# Patient Record
Sex: Male | Born: 1965 | Race: Black or African American | Hispanic: No | Marital: Married | Smoking: Never smoker
Health system: Southern US, Community
[De-identification: ages and names within clinical notes are randomized; demographics above are authoritative.]

## PROBLEM LIST (undated history)

## (undated) DIAGNOSIS — E7800 Pure hypercholesterolemia, unspecified: Secondary | ICD-10-CM

## (undated) DIAGNOSIS — L4050 Arthropathic psoriasis, unspecified: Secondary | ICD-10-CM

## (undated) HISTORY — DX: Pure hypercholesterolemia, unspecified: E78.00

## (undated) HISTORY — PX: BACK SURGERY: SHX140

## (undated) HISTORY — DX: Arthropathic psoriasis, unspecified: L40.50

---

## 2000-01-27 ENCOUNTER — Ambulatory Visit (HOSPITAL_COMMUNITY): Admission: RE | Admit: 2000-01-27 | Discharge: 2000-01-27 | Payer: Self-pay | Admitting: Urology

## 2000-01-27 ENCOUNTER — Encounter: Payer: Self-pay | Admitting: Urology

## 2007-12-24 ENCOUNTER — Encounter
Admission: RE | Admit: 2007-12-24 | Discharge: 2007-12-24 | Payer: Self-pay | Admitting: Physical Medicine and Rehabilitation

## 2008-07-14 ENCOUNTER — Emergency Department (HOSPITAL_BASED_OUTPATIENT_CLINIC_OR_DEPARTMENT_OTHER): Admission: EM | Admit: 2008-07-14 | Discharge: 2008-07-14 | Payer: Self-pay | Admitting: Emergency Medicine

## 2011-02-07 NOTE — Op Note (Signed)
[place]  Patient:    Ronald Norton, Ronald Norton                     MRN: [id number] Proc. Date: 01/27/00 Attending:  Loraine Leriche C. Vernie Ammons, M.D.                           Operative Report  PREOPERATIVE DIAGNOSIS:  Gross hematuria.  POSTOPERATIVE DIAGNOSIS:  Gross hematuria and bladder diverticulum.  PROCEDURE:  Cystoscopy and right retrograde pyelogram.  SURGEON:  Mark C. Vernie Ammons, M.D.  ANESTHESIA:  Intravenous sedation.  DRAINS:  None.  SPECIMENS:  None.  COMPLICATIONS:  None.  ESTIMATED BLOOD LOSS:  Less than 1 cc.  INDICATIONS:  The patient is a 45 year old, white male who has had an episode of gross hematuria many years ago.  He never had any further hematuria until just recently, when he had an episode of gross, painless hematuria.  This resulted in a non-contrasted CT scan, which revealed no stones.  The urothelium of the upper tract has not been evaluated, and he needs lower tract evaluation as well.  DESCRIPTION OF PROCEDURE:  After informed consent, the patient ______ , placed on the table, and administered intravenous sedation.  He was then moved into the dorsal lithotomy position.  His genitalia was thoroughly prepped and draped.  A #22 French cystoscope was introduced per the urethra.  The urethra was noted to be entirely normal down to the sphincter, which appeared intact. The prostatic urethra revealed a normal ______ , no posterior urethral valves, lesions, or other abnormalities of the prostatic urethra.  The bladder neck appeared normal.  The bladder itself had 1+ trabeculation, but no tumor, stones, or inflammatory lesions.  Just lateral and slightly superior to the left ureteral orifice seal region was a small bladder diverticulum.  This was inspected and found to be free of any tumor, stones, or inflammatory lesions as well.  The right ureteral orifice was cannulated with a #6 [place] cone-tip ureteral catheter, and a right  retrograde pyelogram was performed in the standard fashion.  This revealed a normal appearing ureter throughout its course without filling defect or other abnormality.  It drained freely.  The angle was virtually lateral though, making it very difficult to perform the study. I tried to cannulate the left orifice, but I think due to the diverticulum and the very lateral orientation of the orifice, it was difficult to gain access to the ureteral orifice.  I tried using an open-ended #6 [place] ureteral catheter and passing a guide wire, but this was unsuccessful as well.  I therefore emptied the bladder, and the patient was awakened and taken to the recovery room in stable and satisfactory condition.  He will be given 200 mg of Pyridium in the recovery room, and he will need to follow up in my office in a week for an IVP to make sure that his left system is normal, especially with the diverticulum. DD:  01/27/00 TD:  01/28/00 Job: [id number]

## 2015-12-18 ENCOUNTER — Encounter (HOSPITAL_BASED_OUTPATIENT_CLINIC_OR_DEPARTMENT_OTHER): Payer: Self-pay | Admitting: *Deleted

## 2015-12-18 ENCOUNTER — Emergency Department (HOSPITAL_BASED_OUTPATIENT_CLINIC_OR_DEPARTMENT_OTHER)
Admission: EM | Admit: 2015-12-18 | Discharge: 2015-12-18 | Disposition: A | Discharge status: Home / Self Care | Payer: BLUE CROSS/BLUE SHIELD | Attending: Emergency Medicine | Admitting: Emergency Medicine

## 2015-12-18 DIAGNOSIS — Y9389 Activity, other specified: Secondary | ICD-10-CM | POA: Diagnosis not present

## 2015-12-18 DIAGNOSIS — Y990 Civilian activity done for income or pay: Secondary | ICD-10-CM | POA: Diagnosis not present

## 2015-12-18 DIAGNOSIS — Z79899 Other long term (current) drug therapy: Secondary | ICD-10-CM | POA: Insufficient documentation

## 2015-12-18 DIAGNOSIS — W228XXA Striking against or struck by other objects, initial encounter: Secondary | ICD-10-CM | POA: Insufficient documentation

## 2015-12-18 DIAGNOSIS — S0990XA Unspecified injury of head, initial encounter: Secondary | ICD-10-CM | POA: Insufficient documentation

## 2015-12-18 DIAGNOSIS — Y9289 Other specified places as the place of occurrence of the external cause: Secondary | ICD-10-CM | POA: Insufficient documentation

## 2015-12-18 NOTE — ED Notes (Signed)
Pt verbalizes understanding of d/c instructions and denies any further needs at this time. 

## 2015-12-18 NOTE — Discharge Instructions (Signed)
Concussion, Adult  A concussion, or closed-head injury, is a brain injury caused by a direct blow to the head or by a quick and sudden movement (jolt) of the head or neck. Concussions are usually not life-threatening. Even so, the effects of a concussion can be serious. If you have had a concussion before, you are more likely to experience concussion-like symptoms after a direct blow to the head.   CAUSES  · Direct blow to the head, such as from running into another player during a soccer game, being hit in a fight, or hitting your head on a hard surface.  · A jolt of the head or neck that causes the brain to move back and forth inside the skull, such as in a car crash.  SIGNS AND SYMPTOMS  The signs of a concussion can be hard to notice. Early on, they may be missed by you, family members, and health care providers. You may look fine but act or feel differently.  Symptoms are usually temporary, but they may last for days, weeks, or even longer. Some symptoms may appear right away while others may not show up for hours or days. Every head injury is different. Symptoms include:  · Mild to moderate headaches that will not go away.  · A feeling of pressure inside your head.  · Having more trouble than usual:    Learning or remembering things you have heard.    Answering questions.    Paying attention or concentrating.    Organizing daily tasks.    Making decisions and solving problems.  · Slowness in thinking, acting or reacting, speaking, or reading.  · Getting lost or being easily confused.  · Feeling tired all the time or lacking energy (fatigued).  · Feeling drowsy.  · Sleep disturbances.    Sleeping more than usual.    Sleeping less than usual.    Trouble falling asleep.    Trouble sleeping (insomnia).  · Loss of balance or feeling lightheaded or dizzy.  · Nausea or vomiting.  · Numbness or tingling.  · Increased sensitivity to:    Sounds.    Lights.    Distractions.  · Vision problems or eyes that tire  easily.  · Diminished sense of taste or smell.  · Ringing in the ears.  · Mood changes such as feeling sad or anxious.  · Becoming easily irritated or angry for little or no reason.  · Lack of motivation.  · Seeing or hearing things other people do not see or hear (hallucinations).  DIAGNOSIS  Your health care provider can usually diagnose a concussion based on a description of your injury and symptoms. He or she will ask whether you passed out (lost consciousness) and whether you are having trouble remembering events that happened right before and during your injury.  Your evaluation might include:  · A brain scan to look for signs of injury to the brain. Even if the test shows no injury, you may still have a concussion.  · Blood tests to be sure other problems are not present.  TREATMENT  · Concussions are usually treated in an emergency department, in urgent care, or at a clinic. You may need to stay in the hospital overnight for further treatment.  · Tell your health care provider if you are taking any medicines, including prescription medicines, over-the-counter medicines, and natural remedies. Some medicines, such as blood thinners (anticoagulants) and aspirin, may increase the chance of complications. Also tell your health care   provider whether you have had alcohol or are taking illegal drugs. This information may affect treatment.  · Your health care provider will send you home with important instructions to follow.  · How fast you will recover from a concussion depends on many factors. These factors include how severe your concussion is, what part of your brain was injured, your age, and how healthy you were before the concussion.  · Most people with mild injuries recover fully. Recovery can take time. In general, recovery is slower in older persons. Also, persons who have had a concussion in the past or have other medical problems may find that it takes longer to recover from their current injury.  HOME  CARE INSTRUCTIONS  General Instructions  · Carefully follow the directions your health care provider gave you.  · Only take over-the-counter or prescription medicines for pain, discomfort, or fever as directed by your health care provider.  · Take only those medicines that your health care provider has approved.  · Do not drink alcohol until your health care provider says you are well enough to do so. Alcohol and certain other drugs may slow your recovery and can put you at risk of further injury.  · If it is harder than usual to remember things, write them down.  · If you are easily distracted, try to do one thing at a time. For example, do not try to watch TV while fixing dinner.  · Talk with family members or close friends when making important decisions.  · Keep all follow-up appointments. Repeated evaluation of your symptoms is recommended for your recovery.  · Watch your symptoms and tell others to do the same. Complications sometimes occur after a concussion. Older adults with a brain injury may have a higher risk of serious complications, such as a blood clot on the brain.  · Tell your teachers, school nurse, school counselor, coach, athletic trainer, or work manager about your injury, symptoms, and restrictions. Tell them about what you can or cannot do. They should watch for:    Increased problems with attention or concentration.    Increased difficulty remembering or learning new information.    Increased time needed to complete tasks or assignments.    Increased irritability or decreased ability to cope with stress.    Increased symptoms.  · Rest. Rest helps the brain to heal. Make sure you:    Get plenty of sleep at night. Avoid staying up late at night.    Keep the same bedtime hours on weekends and weekdays.    Rest during the day. Take daytime naps or rest breaks when you feel tired.  · Limit activities that require a lot of thought or concentration. These include:    Doing homework or job-related  work.    Watching TV.    Working on the computer.  · Avoid any situation where there is potential for another head injury (football, hockey, soccer, basketball, martial arts, downhill snow sports and horseback riding). Your condition will get worse every time you experience a concussion. You should avoid these activities until you are evaluated by the appropriate follow-up health care providers.  Returning To Your Regular Activities  You will need to return to your normal activities slowly, not all at once. You must give your body and brain enough time for recovery.  · Do not return to sports or other athletic activities until your health care provider tells you it is safe to do so.  · Ask   your health care provider when you can drive, ride a bicycle, or operate heavy machinery. Your ability to react may be slower after a brain injury. Never do these activities if you are dizzy.  · Ask your health care provider about when you can return to work or school.  Preventing Another Concussion  It is very important to avoid another brain injury, especially before you have recovered. In rare cases, another injury can lead to permanent brain damage, brain swelling, or death. The risk of this is greatest during the first 7-10 days after a head injury. Avoid injuries by:  · Wearing a seat belt when riding in a car.  · Drinking alcohol only in moderation.  · Wearing a helmet when biking, skiing, skateboarding, skating, or doing similar activities.  · Avoiding activities that could lead to a second concussion, such as contact or recreational sports, until your health care provider says it is okay.  · Taking safety measures in your home.    Remove clutter and tripping hazards from floors and stairways.    Use grab bars in bathrooms and handrails by stairs.    Place non-slip mats on floors and in bathtubs.    Improve lighting in dim areas.  SEEK MEDICAL CARE IF:  · You have increased problems paying attention or  concentrating.  · You have increased difficulty remembering or learning new information.  · You need more time to complete tasks or assignments than before.  · You have increased irritability or decreased ability to cope with stress.  · You have more symptoms than before.  Seek medical care if you have any of the following symptoms for more than 2 weeks after your injury:  · Lasting (chronic) headaches.  · Dizziness or balance problems.  · Nausea.  · Vision problems.  · Increased sensitivity to noise or light.  · Depression or mood swings.  · Anxiety or irritability.  · Memory problems.  · Difficulty concentrating or paying attention.  · Sleep problems.  · Feeling tired all the time.  SEEK IMMEDIATE MEDICAL CARE IF:  · You have severe or worsening headaches. These may be a sign of a blood clot in the brain.  · You have weakness (even if only in one hand, leg, or part of the face).  · You have numbness.  · You have decreased coordination.  · You vomit repeatedly.  · You have increased sleepiness.  · One pupil is larger than the other.  · You have convulsions.  · You have slurred speech.  · You have increased confusion. This may be a sign of a blood clot in the brain.  · You have increased restlessness, agitation, or irritability.  · You are unable to recognize people or places.  · You have neck pain.  · It is difficult to wake you up.  · You have unusual behavior changes.  · You lose consciousness.  MAKE SURE YOU:  · Understand these instructions.  · Will watch your condition.  · Will get help right away if you are not doing well or get worse.     This information is not intended to replace advice given to you by your health care provider. Make sure you discuss any questions you have with your health care provider.     Document Released: 11/29/2003 Document Revised: 09/29/2014 Document Reviewed: 03/31/2013  Elsevier Interactive Patient Education ©2016 [employer]

## 2015-12-18 NOTE — ED Notes (Signed)
Provider at the bedside, pt c/o head pain and mild nausea after hitting his head yesterday.  No LOC, no dizziness, no weakness or confusion.  Pt is alert and oriented.

## 2015-12-18 NOTE — ED Provider Notes (Signed)
CSN: [id number]     Arrival date & time 12/18/15  1723 History   First MD Initiated Contact with Patient 12/18/15 1927     Chief Complaint  Patient presents with  . Head Injury   HPI   50 year old male presents today with head injury. 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 He denies any loss of consciousness reports pain to the top of the head, no bleeding, bruising, abnormalities noted. Patient denies a neck pain, stiffness, dizziness or vomiting at that time. Patient reports today he had some light sensitivity, some nausea no vomiting. Patient denies any neurological deficits, not taking any blood thinners or anticoagulants. No other complaints noted.  Notes that several months ago patient had hit his head before, complete resolution of symptoms thereafter.    History reviewed. No pertinent past medical history. Past Surgical History  Procedure Laterality Date  . Back surgery      lumbar disk and fusion   Family History  Problem Relation Age of Onset  . Hypertension Father    Social History  Substance Use Topics  . Smoking status: Never Smoker   . Smokeless tobacco: None  . Alcohol Use: No    Review of Systems  All other systems reviewed and are negative.   Allergies  Review of patient's allergies indicates no known allergies.  Home Medications   Prior to Admission medications   Medication Sig Start Date End Date Taking? Authorizing Provider  b complex vitamins tablet Take 1 tablet by mouth daily.    Historical Provider, MD  ibuprofen (ADVIL,MOTRIN) 800 MG tablet Take 800 mg by mouth every 8 (eight) hours as needed.    Historical Provider, MD  sildenafil (REVATIO) 20 MG tablet Take 20 mg by mouth daily.    Historical Provider, MD   BP 118/81 mmHg  Pulse 72  Temp(Src) 98.4 F (36.9 C) (Oral)  Resp 18  Ht 5\' 9"  (1.753 m)  Wt 84.369 kg  BMI 27.45 kg/m2  SpO2 96%    Physical Exam  Constitutional: He is oriented to person, place, and time. He appears well-developed and well-nourished.  HENT:  Head: Normocephalic and atraumatic.  Minor tenderness to palpation of the top of the head  Eyes: Conjunctivae are normal. Pupils are equal, round, and reactive to light. Right eye exhibits no discharge. Left eye exhibits no discharge. No scleral icterus.  Neck: Normal range of motion. No JVD present. No tracheal deviation present.  Pulmonary/Chest: Effort normal. No stridor.  Musculoskeletal:  No neck pain or stiffness, nontender to palpation of the cervical or thoracic spine  Neurological: He is alert and oriented to person, place, and time. He has normal strength. No cranial nerve deficit or sensory deficit. Coordination and gait normal. GCS eye subscore is 4. GCS verbal subscore is 5. GCS motor subscore is 6.  Reflex Scores:      Patellar reflexes are 2+ on the right side and 2+ on the left side. Psychiatric: He has a normal mood and affect. His behavior is normal. Judgment and thought content normal.  Nursing note and vitals reviewed.   ED Course  Procedures (including critical care time) Labs Review Labs Reviewed - No data to display  Imaging Review No results found. I have personally reviewed and evaluated these images and lab results as part of my medical decision-making.   EKG Interpretation None  MDM   Final diagnoses:  Head injury, initial encounter    Labs:   Imaging:  Consults:  Therapeutics:  Discharge Meds:   Assessment/Plan:50 year old male presents today with head injury. He has no significant signs or symptoms of significant head trauma on exam or history. Patient does not meet CT criteria based on the [place] head CT criteria. Patient appears to be in no distress, watching the past came on the TV and avoiding my conversations. Patient has no neurological deficits, no other injuries noted. Patient discharged home with  symptomatic care instructions, and strict return precautions, and primary care follow-up if symptoms persist. Both patient and his wife verbalized understanding and agreement today's plan had no further questions or concerns at time of discharge        Eyvonne Mechanic, PA-C 12/18/15 2028  Pricilla Loveless, MD 12/20/15 [phone]

## 2015-12-18 NOTE — ED Notes (Signed)
He hit the top of his head yesterday. Sharp pain in his head since. Nausea today. Light sensitive.

## 2016-03-31 DIAGNOSIS — Z23 Encounter for immunization: Secondary | ICD-10-CM | POA: Diagnosis not present

## 2016-03-31 DIAGNOSIS — Z0000 Encounter for general adult medical examination without abnormal findings: Secondary | ICD-10-CM | POA: Diagnosis not present

## 2016-03-31 DIAGNOSIS — E782 Mixed hyperlipidemia: Secondary | ICD-10-CM | POA: Diagnosis not present

## 2016-05-15 DIAGNOSIS — G4700 Insomnia, unspecified: Secondary | ICD-10-CM | POA: Diagnosis not present

## 2016-06-11 DIAGNOSIS — M79671 Pain in right foot: Secondary | ICD-10-CM | POA: Diagnosis not present

## 2016-06-27 DIAGNOSIS — M5416 Radiculopathy, lumbar region: Secondary | ICD-10-CM | POA: Diagnosis not present

## 2016-07-01 ENCOUNTER — Other Ambulatory Visit: Payer: Self-pay | Admitting: Family Medicine

## 2016-07-01 DIAGNOSIS — M5416 Radiculopathy, lumbar region: Secondary | ICD-10-CM

## 2016-07-03 DIAGNOSIS — M25511 Pain in right shoulder: Secondary | ICD-10-CM | POA: Diagnosis not present

## 2016-07-07 DIAGNOSIS — M25511 Pain in right shoulder: Secondary | ICD-10-CM | POA: Diagnosis not present

## 2016-07-14 ENCOUNTER — Ambulatory Visit
Admission: RE | Admit: 2016-07-14 | Discharge: 2016-07-14 | Disposition: A | Discharge status: Home / Self Care | Payer: BLUE CROSS/BLUE SHIELD | Source: Ambulatory Visit | Attending: Family Medicine | Admitting: Family Medicine

## 2016-07-14 DIAGNOSIS — M48061 Spinal stenosis, lumbar region without neurogenic claudication: Secondary | ICD-10-CM | POA: Diagnosis not present

## 2016-07-14 DIAGNOSIS — M5416 Radiculopathy, lumbar region: Secondary | ICD-10-CM

## 2016-07-16 DIAGNOSIS — Z6826 Body mass index (BMI) 26.0-26.9, adult: Secondary | ICD-10-CM | POA: Diagnosis not present

## 2016-07-16 DIAGNOSIS — M5136 Other intervertebral disc degeneration, lumbar region: Secondary | ICD-10-CM | POA: Diagnosis not present

## 2016-07-16 DIAGNOSIS — M25511 Pain in right shoulder: Secondary | ICD-10-CM | POA: Diagnosis not present

## 2016-07-21 DIAGNOSIS — M25511 Pain in right shoulder: Secondary | ICD-10-CM | POA: Diagnosis not present

## 2016-08-07 DIAGNOSIS — G8918 Other acute postprocedural pain: Secondary | ICD-10-CM | POA: Diagnosis not present

## 2016-08-07 DIAGNOSIS — M5136 Other intervertebral disc degeneration, lumbar region: Secondary | ICD-10-CM | POA: Diagnosis not present

## 2016-08-07 DIAGNOSIS — Z6827 Body mass index (BMI) 27.0-27.9, adult: Secondary | ICD-10-CM | POA: Diagnosis not present

## 2016-08-07 DIAGNOSIS — M24111 Other articular cartilage disorders, right shoulder: Secondary | ICD-10-CM | POA: Diagnosis not present

## 2016-08-07 DIAGNOSIS — R030 Elevated blood-pressure reading, without diagnosis of hypertension: Secondary | ICD-10-CM | POA: Diagnosis not present

## 2016-08-07 DIAGNOSIS — S43004A Unspecified dislocation of right shoulder joint, initial encounter: Secondary | ICD-10-CM | POA: Diagnosis not present

## 2016-08-18 DIAGNOSIS — M24111 Other articular cartilage disorders, right shoulder: Secondary | ICD-10-CM | POA: Diagnosis not present

## 2016-08-26 DIAGNOSIS — M48062 Spinal stenosis, lumbar region with neurogenic claudication: Secondary | ICD-10-CM | POA: Diagnosis not present

## 2016-08-26 DIAGNOSIS — M5136 Other intervertebral disc degeneration, lumbar region: Secondary | ICD-10-CM | POA: Diagnosis not present

## 2016-09-03 DIAGNOSIS — R030 Elevated blood-pressure reading, without diagnosis of hypertension: Secondary | ICD-10-CM | POA: Diagnosis not present

## 2016-09-03 DIAGNOSIS — M48062 Spinal stenosis, lumbar region with neurogenic claudication: Secondary | ICD-10-CM | POA: Diagnosis not present

## 2016-09-12 DIAGNOSIS — M24111 Other articular cartilage disorders, right shoulder: Secondary | ICD-10-CM | POA: Diagnosis not present

## 2016-12-23 DIAGNOSIS — L218 Other seborrheic dermatitis: Secondary | ICD-10-CM | POA: Diagnosis not present

## 2017-03-12 DIAGNOSIS — Z113 Encounter for screening for infections with a predominantly sexual mode of transmission: Secondary | ICD-10-CM | POA: Diagnosis not present

## 2017-03-12 DIAGNOSIS — Z7253 High risk bisexual behavior: Secondary | ICD-10-CM | POA: Diagnosis not present

## 2017-04-17 DIAGNOSIS — Z0000 Encounter for general adult medical examination without abnormal findings: Secondary | ICD-10-CM | POA: Diagnosis not present

## 2017-04-17 DIAGNOSIS — Z13220 Encounter for screening for lipoid disorders: Secondary | ICD-10-CM | POA: Diagnosis not present

## 2017-04-23 DIAGNOSIS — M1712 Unilateral primary osteoarthritis, left knee: Secondary | ICD-10-CM | POA: Diagnosis not present

## 2017-04-23 DIAGNOSIS — M1711 Unilateral primary osteoarthritis, right knee: Secondary | ICD-10-CM | POA: Diagnosis not present

## 2018-04-13 ENCOUNTER — Emergency Department (HOSPITAL_BASED_OUTPATIENT_CLINIC_OR_DEPARTMENT_OTHER)
Admission: EM | Admit: 2018-04-13 | Discharge: 2018-04-13 | Disposition: A | Discharge status: Home / Self Care | Payer: BLUE CROSS/BLUE SHIELD | Attending: Emergency Medicine | Admitting: Emergency Medicine

## 2018-04-13 ENCOUNTER — Encounter (HOSPITAL_BASED_OUTPATIENT_CLINIC_OR_DEPARTMENT_OTHER): Payer: Self-pay

## 2018-04-13 ENCOUNTER — Other Ambulatory Visit: Payer: Self-pay

## 2018-04-13 DIAGNOSIS — Y9389 Activity, other specified: Secondary | ICD-10-CM | POA: Insufficient documentation

## 2018-04-13 DIAGNOSIS — Y929 Unspecified place or not applicable: Secondary | ICD-10-CM | POA: Insufficient documentation

## 2018-04-13 DIAGNOSIS — S61412A Laceration without foreign body of left hand, initial encounter: Secondary | ICD-10-CM | POA: Diagnosis not present

## 2018-04-13 DIAGNOSIS — W270XXA Contact with workbench tool, initial encounter: Secondary | ICD-10-CM | POA: Insufficient documentation

## 2018-04-13 DIAGNOSIS — Y990 Civilian activity done for income or pay: Secondary | ICD-10-CM | POA: Diagnosis not present

## 2018-04-13 MED ORDER — LIDOCAINE-EPINEPHRINE (PF) 2 %-1:200000 IJ SOLN
10.0000 mL | Freq: Once | INTRAMUSCULAR | Status: AC
  Administered 2018-04-13: 10 mL via INTRADERMAL
  Filled 2018-04-13 (×2): qty 10

## 2018-04-13 NOTE — Discharge Instructions (Addendum)
Keep your hand clean and dry.  Wash with soap and water.  Apply triple antibiotic ointment twice a day.  Follow-up in 7 to 10 days for suture removal.  Return if any signs of infection

## 2018-04-13 NOTE — ED Triage Notes (Signed)
Pt states he pinched hand between 2 wrenches at work approx 3pm-small lac noted-NAD-steady gait

## 2018-04-13 NOTE — ED Provider Notes (Signed)
[facility] EMERGENCY DEPARTMENT Provider Note   CSN: [id number] Arrival date & time: 04/13/18  2006     History   Chief Complaint Chief Complaint  Patient presents with  . Hand Injury    HPI Ronald CapeDano B Henslee Jr. is a 52 y.o. male.  HPI  Ronald CapeDano B Brandle Jr. is a 52 y.o. male Presents to emergency department complaining of hand laceration.  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  He sustained a laceration to the palm of the hand.  He denies any weakness or numbness to the fingers.  He states bleeding was controlled with pressure.  He states he washed his hand and applied peroxide.  He denies any other treatment.  States "insides are coming out that so I decided to come in."  Tetanus is within last 3 years.   History reviewed. No pertinent past medical history.  There are no active problems to display for this patient.   Past Surgical History:  Procedure Laterality Date  . BACK SURGERY     lumbar disk and fusion        Home Medications    Prior to Admission medications   Medication Sig Start Date End Date Taking? Authorizing Provider  b complex vitamins tablet Take 1 tablet by mouth daily.    [provider]  ibuprofen (ADVIL,MOTRIN) 800 MG tablet Take 800 mg by mouth every 8 (eight) hours as needed.    [provider]  sildenafil (REVATIO) 20 MG tablet Take 20 mg by mouth daily.    [provider]    Family History Family History  Problem Relation Age of Onset  . Hypertension Father     Social History Social History   Tobacco Use  . Smoking status: Never Smoker  . Smokeless tobacco: Never Used  Substance Use Topics  . Alcohol use: Yes    Comment: occ  . Drug use: No     Allergies   Patient has no known allergies.   Review of Systems Review of Systems  Constitutional: Negative for chills and fever.  Skin: Positive for wound.  Neurological: Negative  for weakness and numbness.  All other systems reviewed and are negative.    Physical Exam Updated Vital Signs BP (!) 132/92 (BP Location: Right Arm)   Pulse 80   Temp 98.4 F (36.9 C) (Oral)   Resp 18   Ht 5\' 9"  (1.753 m)   Wt 82.6 kg (182 lb)   SpO2 99%   BMI 26.88 kg/m    Physical Exam  Constitutional: He appears well-developed and well-nourished. No distress.  Eyes: Conjunctivae are normal.  Neck: Neck supple.  Cardiovascular: Normal rate.  Pulmonary/Chest: No respiratory distress.  Abdominal: He exhibits no distension.  Musculoskeletal:  1 cm laceration to the palm of the left hand, just proximal to the fourth and fifth MCP joints.  Appears to be superficial but gaping.  Some fatty tissue exposed.  Skin: Skin is warm and dry.  Nursing note and vitals reviewed.    ED Treatments / Results  Labs (all labs ordered are listed, but only abnormal results are displayed) Labs Reviewed - No data to display  EKG None  Radiology No results found.  Procedures .Marland Kitchen.Laceration Repair Date/Time: 04/13/2018 10:48 PM Performed by: Jaynie CrumbleKirichenko, Dacia Capers, PA-C Authorized by: Jaynie CrumbleKirichenko, Ritika Hellickson, PA-C   Consent:    Consent obtained:  Verbal   Consent given by:  Patient   Risks discussed:  Infection, pain, retained foreign body, need for additional repair and poor cosmetic result   Alternatives discussed:  No treatment Anesthesia (see MAR for exact dosages):    Anesthesia method:  Local infiltration   Local anesthetic:  Lidocaine 2% WITH epi Laceration details:    Location:  Hand   Hand location:  L palm   Length (cm):  1 Repair type:    Repair type:  Simple Pre-procedure details:    Preparation:  Patient was prepped and draped in usual sterile fashion Exploration:    Wound exploration: wound explored through full range of motion     Wound extent: no muscle damage noted, no nerve damage noted and no tendon damage noted     Contaminated: yes   Treatment:    Area cleansed  with:  Saline, Shur-Clens and Betadine   Amount of cleaning:  Extensive   Irrigation solution:  Sterile saline   Irrigation method:  Syringe and pressure wash Skin repair:    Repair method:  Sutures   Suture size:  5-0   Suture material:  Prolene   Suture technique:  Simple interrupted   Number of sutures:  3 Approximation:    Approximation:  Close Post-procedure details:    Dressing:  Antibiotic ointment   Patient tolerance of procedure:  Tolerated well, no immediate complications   (including critical care time)  Medications Ordered in ED Medications  lidocaine-EPINEPHrine (XYLOCAINE W/EPI) 2 %-1:200000 (PF) injection 10 mL (has no administration in time range)     Initial Impression / Assessment and Plan / ED Course  I have reviewed the triage vital signs and the nursing notes.  Pertinent labs & imaging results that were available during my care of the patient were reviewed by me and considered in my medical decision making (see chart for details).     Patient with a very small laceration to the left hand, however gaping with some fatty tissue exposed.  Required 3 sutures to close.  Wound was contaminated with grease oil and dirt.  I extensively irrigated the wound and washed with saline and iodine.  Repaired with Prolene.  Patient's tetanus is up-to-date.  We will have him follow-up in 7 to 10 days for suture removal.  Topical antibiotic ointment.  Return precautions discussed.  Vitals:   04/13/18 2015 04/13/18 2018 04/13/18 2212  BP:  (!) 132/92 125/85  Pulse:  80 73  Resp:  18 16  Temp:  98.4 F (36.9 C)   TempSrc:  Oral   SpO2:  99% 99%  Weight: 82.6 kg (182 lb)    Height: 5\' 9"  (1.753 m)      Final Clinical Impressions(s) / ED Diagnoses   Final diagnoses:  Laceration of left hand without foreign body, initial encounter    ED Discharge Orders    None      Jaynie Crumble, PA-C 04/13/18 2250  Maia Plan, MD 04/14/18 1114

## 2018-05-11 DIAGNOSIS — Z0000 Encounter for general adult medical examination without abnormal findings: Secondary | ICD-10-CM | POA: Diagnosis not present

## 2018-05-11 DIAGNOSIS — Z125 Encounter for screening for malignant neoplasm of prostate: Secondary | ICD-10-CM | POA: Diagnosis not present

## 2018-05-11 DIAGNOSIS — Z13220 Encounter for screening for lipoid disorders: Secondary | ICD-10-CM | POA: Diagnosis not present

## 2018-05-13 DIAGNOSIS — M549 Dorsalgia, unspecified: Secondary | ICD-10-CM | POA: Diagnosis not present

## 2018-05-13 DIAGNOSIS — L299 Pruritus, unspecified: Secondary | ICD-10-CM | POA: Diagnosis not present

## 2018-05-13 DIAGNOSIS — Z0000 Encounter for general adult medical examination without abnormal findings: Secondary | ICD-10-CM | POA: Diagnosis not present

## 2018-05-13 DIAGNOSIS — R251 Tremor, unspecified: Secondary | ICD-10-CM | POA: Diagnosis not present

## 2018-05-13 DIAGNOSIS — G4700 Insomnia, unspecified: Secondary | ICD-10-CM | POA: Diagnosis not present

## 2018-06-15 ENCOUNTER — Encounter: Payer: Self-pay | Admitting: Diagnostic Neuroimaging

## 2018-06-15 ENCOUNTER — Encounter: Payer: Self-pay | Admitting: *Deleted

## 2018-06-15 ENCOUNTER — Ambulatory Visit: Payer: BLUE CROSS/BLUE SHIELD | Admitting: Diagnostic Neuroimaging

## 2018-06-15 VITALS — BP 142/83 | HR 90 | Ht 69.0 in | Wt 182.0 lb

## 2018-06-15 DIAGNOSIS — R251 Tremor, unspecified: Secondary | ICD-10-CM | POA: Diagnosis not present

## 2018-06-15 DIAGNOSIS — G250 Essential tremor: Secondary | ICD-10-CM | POA: Diagnosis not present

## 2018-06-15 MED ORDER — PROPRANOLOL HCL 40 MG PO TABS: 40.0000 mg | ORAL_TABLET | Freq: Two times a day (BID) | ORAL | 12 refills | Status: DC

## 2018-06-15 NOTE — Patient Instructions (Signed)
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

## 2018-06-15 NOTE — Progress Notes (Signed)
[facility]  PATIENT: Ronald Norton. DOB: [birth year]  REFERRING CLINICIAN: C A Ross HISTORY FROM: patient  REASON FOR VISIT: new consult    HISTORICAL  CHIEF COMPLAINT:  Chief Complaint  Patient presents with  . New Patient (Initial Visit)    Rm 6, alone  . progressive tremor    Dr. Tenny Craw.  Has had tremors over the last several yrs.  Recently over the last month, pregressively gotten worse.  Bilateral hands,  R>L.      HISTORY OF PRESENT ILLNESS:   52 year old male here for evaluation of tremors.  Patient has had mild postural tremors since 2009.  Is gotten worse.  He has noticed particular problems in the last 6 to 12 months.  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  Symptoms fluctuate on a day-to-day basis.  No specific triggering factors.  No change with alcohol.  No family history of tremor.  He has noticed some mild head tremor in the last few months.   REVIEW OF SYSTEMS: Full 14 system review of systems performed and negative with exception of: Cough chest pain.  ALLERGIES: No Known Allergies  HOME MEDICATIONS: Outpatient Medications Prior to Visit  Medication Sig Dispense Refill  . ibuprofen (ADVIL,MOTRIN) 800 MG tablet Take 800 mg by mouth every 8 (eight) hours as needed.    . Melatonin 5 MG CAPS Take 1 capsule by mouth at bedtime as needed.    . sildenafil (REVATIO) 20 MG tablet Take 20 mg by mouth daily.    . traZODone (DESYREL) 100 MG tablet Take 100-200 mg by mouth at bedtime.      No facility-administered medications prior to visit.     PAST MEDICAL HISTORY: Past Medical History:  Diagnosis Date  . High cholesterol     PAST SURGICAL HISTORY: Past Surgical History:  Procedure Laterality Date  . BACK SURGERY     lumbar disk and fusion    FAMILY HISTORY: Family History  Problem Relation Age of Onset  . Arthritis Mother        degenerative disc disease  .  Hypertension Father   . Arthritis/Rheumatoid Father   . Diabetes Maternal Uncle   . Diabetes Paternal Uncle     SOCIAL HISTORY: Social History   Socioeconomic History  . Marital status: Married    Spouse name: Not on file  . Number of children: Not on file  . Years of education: Not on file  . Highest education level: Not on file  Occupational History  . Not on file  Social Needs  . Financial resource strain: Not on file  . Food insecurity:    Worry: Not on file    Inability: Not on file  . Transportation needs:    Medical: Not on file    Non-medical: Not on file  Tobacco Use  . Smoking status: Never Smoker  . Smokeless tobacco: Never Used  Substance and Sexual Activity  . Alcohol use: Yes    Comment: occ  . Drug use: No  . Sexual activity: Not on file  Lifestyle  . Physical activity:    Days per week: Not on file    Minutes per session: Not on file  . Stress: Not on file  Relationships  . Social connections:    Talks on phone: Not on file    Gets together: Not on file    Attends religious service: Not on file  Active member of club or organization: Not on file    Attends meetings of clubs or organizations: Not on file    Relationship status: Not on file  . Intimate partner violence:    Fear of current or ex partner: Not on file    Emotionally abused: Not on file    Physically abused: Not on file    Forced sexual activity: Not on file  Other Topics Concern  . Not on file  Social History Narrative   Lives home with wife, cheryl.  Is self employed.  Education AD.  Children one. Caffeine oz at lunch.       PHYSICAL EXAM  GENERAL EXAM/CONSTITUTIONAL: Vitals:  Vitals:   06/15/18 1540  BP: (!) 142/83  Pulse: 90  Weight: 182 lb (82.6 kg)  Height: 5\' 9"  (1.753 m)     Body mass index is 26.88 kg/m. Wt Readings from Last 3 Encounters:  06/15/18 182 lb (82.6 kg)  04/13/18 182 lb (82.6 kg)  12/18/15 186 lb (84.4 kg)     Patient is in no distress;  well developed, nourished and groomed; neck is supple  CARDIOVASCULAR:  Examination of carotid arteries is normal; no carotid bruits  Regular rate and rhythm, no murmurs  Examination of peripheral vascular system by observation and palpation is normal  EYES:  Ophthalmoscopic exam of optic discs and posterior segments is normal; no papilledema or hemorrhages  Visual Acuity Screening   Right eye Left eye Both eyes  Without correction:     With correction: 20/20 20/20      MUSCULOSKELETAL:  Gait, strength, tone, movements noted in Neurologic exam below  NEUROLOGIC: MENTAL STATUS:  No flowsheet data found.  awake, alert, oriented to person, place and time  recent and remote memory intact  normal attention and concentration  language fluent, comprehension intact, naming intact  fund of knowledge appropriate  CRANIAL NERVE:   2nd - no papilledema on fundoscopic exam  2nd, 3rd, 4th, 6th - pupils equal and reactive to light, visual fields full to confrontation, extraocular muscles intact, no nystagmus  5th - facial sensation symmetric  7th - facial strength symmetric  8th - hearing intact  9th - palate elevates symmetrically, uvula midline  11th - shoulder shrug symmetric  12th - tongue protrusion midline  MOTOR:   MINIMAL POSTURAL AND ACTION TREMOR (LUE > RUE)  NO BRADYKINESIA OR RIGIDITY  normal bulk and tone, full strength in the BUE, BLE  SENSORY:   normal and symmetric to light touch, temperature, vibration  COORDINATION:   finger-nose-finger, fine finger movements normal  REFLEXES:   deep tendon reflexes TRACE and symmetric  GAIT/STATION:   narrow based gait    DIAGNOSTIC DATA (LABS, IMAGING, TESTING) - I reviewed patient records, labs, notes, testing and imaging myself where available.  No results found for: WBC, HGB, HCT, MCV, PLT No results found for: NA, K, CL, CO2, GLUCOSE, BUN, CREATININE, CALCIUM, PROT, ALBUMIN, AST, ALT,  ALKPHOS, BILITOT, GFRNONAA, GFRAA No results found for: CHOL, HDL, LDLCALC, LDLDIRECT, TRIG, CHOLHDL No results found for: WGNF6OHGBA1C No results found for: VITAMINB12 No results found for: TSH  2017 B12 383  2017 TSH 1.12    ASSESSMENT AND PLAN  52 y.o. year old male here with gradual onset progressive postural tremor since 2009, most likely represents essential tremor.  Will check MRI of the brain to rule out other secondary causes.  We will treat with propranolol for symptom control.  Dx:  1. Tremor   2.  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 MR BRAIN W WO CONTRAST   Meds ordered this encounter  Medications  . propranolol (INDERAL) 40 MG tablet    Sig: Take 1 tablet (40 mg total) by mouth 2 (two) times daily.    Dispense:  60 tablet    Refill:  12   Return in about 6 months (around 12/14/2018).    Suanne Marker, MD 06/15/2018, 4:01 PM Certified in Neurology, Neurophysiology and Neuroimaging  [facility] [address], [address] [place], [place] [address] [phone]

## 2018-06-16 ENCOUNTER — Telehealth: Payer: Self-pay | Admitting: Diagnostic Neuroimaging

## 2018-06-16 NOTE — Telephone Encounter (Signed)
[employer] pending faxed clinical notes  °

## 2018-06-16 NOTE — Telephone Encounter (Signed)
I called [employer] they informed me it is needing a peer to peer. The phone number for the peer to peer is [phone]. The member ID is [id number] & DOB is 30-Nov-1965. The case does close on 06/18/18.

## 2018-06-17 NOTE — Telephone Encounter (Signed)
Scan approved.  auth approval / qual review numb: [id number]  -VRP

## 2018-06-21 NOTE — Telephone Encounter (Signed)
Noted, thank you

## 2018-06-21 NOTE — Telephone Encounter (Signed)
LVM for pt to call back about scheduling mri  [employer] auth: [id number] (exp. 06/16/18 to 07/15/18)

## 2018-09-17 DIAGNOSIS — M48062 Spinal stenosis, lumbar region with neurogenic claudication: Secondary | ICD-10-CM | POA: Diagnosis not present

## 2018-12-16 ENCOUNTER — Telehealth: Payer: Self-pay | Admitting: *Deleted

## 2018-12-16 NOTE — Telephone Encounter (Signed)
Attempted to reach patient re: need to convert his FU to video visit. His voice MB was full. Will try later.

## 2018-12-20 ENCOUNTER — Ambulatory Visit: Payer: BLUE CROSS/BLUE SHIELD | Admitting: Diagnostic Neuroimaging

## 2018-12-20 ENCOUNTER — Encounter: Payer: Self-pay | Admitting: *Deleted

## 2018-12-20 NOTE — Telephone Encounter (Addendum)
Attempted to reach patient again to convert FU to video. No answer; voice MB full.  Reached patient on work # and advised him that due to COVID 19 we are recommending converting his appointments to a video appt.  He stated he wasn't sure if he had capability, and he was doing fine. He stated his symptoms are not worse, and he has refills of propranolol. I advised we can reschedule for 6-8 weeks out with Dr Marjory Lies or his NP.  He stated he wanted to think about it and would call back. He verbalized understanding, appreciation.

## 2019-06-30 DIAGNOSIS — Z0000 Encounter for general adult medical examination without abnormal findings: Secondary | ICD-10-CM | POA: Diagnosis not present

## 2019-06-30 DIAGNOSIS — G4700 Insomnia, unspecified: Secondary | ICD-10-CM | POA: Diagnosis not present

## 2019-06-30 DIAGNOSIS — B356 Tinea cruris: Secondary | ICD-10-CM | POA: Diagnosis not present

## 2019-06-30 DIAGNOSIS — Z13220 Encounter for screening for lipoid disorders: Secondary | ICD-10-CM | POA: Diagnosis not present

## 2019-06-30 DIAGNOSIS — M791 Myalgia, unspecified site: Secondary | ICD-10-CM | POA: Diagnosis not present

## 2019-06-30 DIAGNOSIS — E7889 Other lipoprotein metabolism disorders: Secondary | ICD-10-CM | POA: Diagnosis not present

## 2019-06-30 DIAGNOSIS — R634 Abnormal weight loss: Secondary | ICD-10-CM | POA: Diagnosis not present

## 2019-06-30 DIAGNOSIS — Z125 Encounter for screening for malignant neoplasm of prostate: Secondary | ICD-10-CM | POA: Diagnosis not present

## 2019-08-10 DIAGNOSIS — Z1211 Encounter for screening for malignant neoplasm of colon: Secondary | ICD-10-CM | POA: Diagnosis not present

## 2019-08-10 DIAGNOSIS — R634 Abnormal weight loss: Secondary | ICD-10-CM | POA: Diagnosis not present

## 2019-08-10 DIAGNOSIS — R110 Nausea: Secondary | ICD-10-CM | POA: Diagnosis not present

## 2019-08-15 ENCOUNTER — Other Ambulatory Visit: Payer: Self-pay | Admitting: Gastroenterology

## 2019-08-15 DIAGNOSIS — R634 Abnormal weight loss: Secondary | ICD-10-CM

## 2019-08-22 ENCOUNTER — Other Ambulatory Visit: Payer: Self-pay | Admitting: Gastroenterology

## 2019-08-22 DIAGNOSIS — R110 Nausea: Secondary | ICD-10-CM

## 2019-08-25 ENCOUNTER — Other Ambulatory Visit: Payer: BLUE CROSS/BLUE SHIELD

## 2019-09-08 ENCOUNTER — Ambulatory Visit
Admission: RE | Admit: 2019-09-08 | Discharge: 2019-09-08 | Disposition: A | Discharge status: Home / Self Care | Payer: BLUE CROSS/BLUE SHIELD | Source: Ambulatory Visit | Attending: Gastroenterology | Admitting: Gastroenterology

## 2019-09-08 DIAGNOSIS — R110 Nausea: Secondary | ICD-10-CM

## 2019-09-08 DIAGNOSIS — N281 Cyst of kidney, acquired: Secondary | ICD-10-CM | POA: Diagnosis not present

## 2019-09-12 DIAGNOSIS — D485 Neoplasm of uncertain behavior of skin: Secondary | ICD-10-CM | POA: Diagnosis not present

## 2019-09-12 DIAGNOSIS — L308 Other specified dermatitis: Secondary | ICD-10-CM | POA: Diagnosis not present

## 2019-09-13 DIAGNOSIS — Z20828 Contact with and (suspected) exposure to other viral communicable diseases: Secondary | ICD-10-CM | POA: Diagnosis not present

## 2019-09-14 ENCOUNTER — Other Ambulatory Visit: Payer: Self-pay | Admitting: Gastroenterology

## 2019-09-14 DIAGNOSIS — R935 Abnormal findings on diagnostic imaging of other abdominal regions, including retroperitoneum: Secondary | ICD-10-CM

## 2019-10-26 DIAGNOSIS — M7542 Impingement syndrome of left shoulder: Secondary | ICD-10-CM | POA: Diagnosis not present

## 2019-12-01 ENCOUNTER — Ambulatory Visit: Payer: BC Managed Care – PPO | Attending: Internal Medicine

## 2019-12-01 DIAGNOSIS — Z23 Encounter for immunization: Secondary | ICD-10-CM

## 2019-12-01 NOTE — Progress Notes (Signed)
   [id number]  Name:  Ronald Norton.    MRN: [id number] DOB: October 21, 1965  12/01/2019  Ronald Norton was observed post Covid-19 immunization for 15 minutes without incident. He was provided with Vaccine Information Sheet and instruction to access the V-Safe system.   Ronald Norton was instructed to call 911 with any severe reactions post vaccine: Marland Kitchen Difficulty breathing  . Swelling of face and throat  . A fast heartbeat  . A bad rash all over body  . Dizziness and weakness   Immunizations Administered    Name Date Dose VIS Date Route   Pfizer COVID-19 Vaccine 12/01/2019  2:47 PM 0.3 mL 09/02/2019 Intramuscular   Manufacturer: [employer], [employer]   Lot: [id number]   NDC: [address]-1

## 2019-12-07 DIAGNOSIS — M7542 Impingement syndrome of left shoulder: Secondary | ICD-10-CM | POA: Diagnosis not present

## 2019-12-26 ENCOUNTER — Ambulatory Visit: Payer: BC Managed Care – PPO | Attending: Internal Medicine

## 2019-12-26 DIAGNOSIS — Z23 Encounter for immunization: Secondary | ICD-10-CM

## 2019-12-26 NOTE — Progress Notes (Signed)
   [id number]  Name:  Ronald Norton.    MRN: [id number] DOB: 1965-10-14  12/26/2019  Mr. Ronald Norton was observed post Covid-19 immunization for 15 minutes without incident. He was provided with Vaccine Information Sheet and instruction to access the V-Safe system.   Mr. Ronald Norton was instructed to call 911 with any severe reactions post vaccine: Marland Kitchen Difficulty breathing  . Swelling of face and throat  . A fast heartbeat  . A bad rash all over body  . Dizziness and weakness   Immunizations Administered    Name Date Dose VIS Date Route   Pfizer COVID-19 Vaccine 12/26/2019 10:45 AM 0.3 mL 09/02/2019 Intramuscular   Manufacturer: [employer], [employer]   Lot: [id number]   NDC: [address]-9

## 2020-01-31 ENCOUNTER — Other Ambulatory Visit: Payer: Self-pay | Admitting: Gastroenterology

## 2020-01-31 DIAGNOSIS — R935 Abnormal findings on diagnostic imaging of other abdominal regions, including retroperitoneum: Secondary | ICD-10-CM

## 2020-02-02 DIAGNOSIS — M2550 Pain in unspecified joint: Secondary | ICD-10-CM | POA: Diagnosis not present

## 2020-02-16 DIAGNOSIS — Z6825 Body mass index (BMI) 25.0-25.9, adult: Secondary | ICD-10-CM | POA: Diagnosis not present

## 2020-02-16 DIAGNOSIS — R5382 Chronic fatigue, unspecified: Secondary | ICD-10-CM | POA: Diagnosis not present

## 2020-02-16 DIAGNOSIS — M2550 Pain in unspecified joint: Secondary | ICD-10-CM | POA: Diagnosis not present

## 2020-02-16 DIAGNOSIS — E663 Overweight: Secondary | ICD-10-CM | POA: Diagnosis not present

## 2020-02-27 ENCOUNTER — Other Ambulatory Visit: Payer: Self-pay | Admitting: Gastroenterology

## 2020-02-27 DIAGNOSIS — L4059 Other psoriatic arthropathy: Secondary | ICD-10-CM | POA: Diagnosis not present

## 2020-02-27 DIAGNOSIS — M2550 Pain in unspecified joint: Secondary | ICD-10-CM | POA: Diagnosis not present

## 2020-02-27 DIAGNOSIS — M0579 Rheumatoid arthritis with rheumatoid factor of multiple sites without organ or systems involvement: Secondary | ICD-10-CM | POA: Diagnosis not present

## 2020-02-27 DIAGNOSIS — R5382 Chronic fatigue, unspecified: Secondary | ICD-10-CM | POA: Diagnosis not present

## 2020-02-29 ENCOUNTER — Other Ambulatory Visit: Payer: Self-pay

## 2020-02-29 ENCOUNTER — Ambulatory Visit
Admission: RE | Admit: 2020-02-29 | Discharge: 2020-02-29 | Disposition: A | Discharge status: Home / Self Care | Payer: BC Managed Care – PPO | Source: Ambulatory Visit | Attending: Gastroenterology | Admitting: Gastroenterology

## 2020-02-29 DIAGNOSIS — R935 Abnormal findings on diagnostic imaging of other abdominal regions, including retroperitoneum: Secondary | ICD-10-CM

## 2020-02-29 DIAGNOSIS — N281 Cyst of kidney, acquired: Secondary | ICD-10-CM | POA: Diagnosis not present

## 2020-02-29 MED ORDER — GADOBENATE DIMEGLUMINE 529 MG/ML IV SOLN
16.0000 mL | Freq: Once | INTRAVENOUS | Status: AC | PRN
  Administered 2020-02-29: 16 mL via INTRAVENOUS

## 2020-04-17 DIAGNOSIS — R634 Abnormal weight loss: Secondary | ICD-10-CM | POA: Diagnosis not present

## 2020-04-17 DIAGNOSIS — K228 Other specified diseases of esophagus: Secondary | ICD-10-CM | POA: Diagnosis not present

## 2020-04-17 DIAGNOSIS — K449 Diaphragmatic hernia without obstruction or gangrene: Secondary | ICD-10-CM | POA: Diagnosis not present

## 2020-04-17 DIAGNOSIS — K2930 Chronic superficial gastritis without bleeding: Secondary | ICD-10-CM | POA: Diagnosis not present

## 2020-04-17 DIAGNOSIS — Z1211 Encounter for screening for malignant neoplasm of colon: Secondary | ICD-10-CM | POA: Diagnosis not present

## 2020-04-17 DIAGNOSIS — K222 Esophageal obstruction: Secondary | ICD-10-CM | POA: Diagnosis not present

## 2020-04-17 DIAGNOSIS — K621 Rectal polyp: Secondary | ICD-10-CM | POA: Diagnosis not present

## 2020-04-17 DIAGNOSIS — K5730 Diverticulosis of large intestine without perforation or abscess without bleeding: Secondary | ICD-10-CM | POA: Diagnosis not present

## 2020-04-17 DIAGNOSIS — K648 Other hemorrhoids: Secondary | ICD-10-CM | POA: Diagnosis not present

## 2020-04-17 DIAGNOSIS — K2970 Gastritis, unspecified, without bleeding: Secondary | ICD-10-CM | POA: Diagnosis not present

## 2020-04-17 DIAGNOSIS — R110 Nausea: Secondary | ICD-10-CM | POA: Diagnosis not present

## 2020-04-30 DIAGNOSIS — R5382 Chronic fatigue, unspecified: Secondary | ICD-10-CM | POA: Diagnosis not present

## 2020-04-30 DIAGNOSIS — M0579 Rheumatoid arthritis with rheumatoid factor of multiple sites without organ or systems involvement: Secondary | ICD-10-CM | POA: Diagnosis not present

## 2020-04-30 DIAGNOSIS — L4059 Other psoriatic arthropathy: Secondary | ICD-10-CM | POA: Diagnosis not present

## 2020-04-30 DIAGNOSIS — M2550 Pain in unspecified joint: Secondary | ICD-10-CM | POA: Diagnosis not present

## 2020-06-07 DIAGNOSIS — L408 Other psoriasis: Secondary | ICD-10-CM | POA: Diagnosis not present

## 2020-07-03 DIAGNOSIS — Z0000 Encounter for general adult medical examination without abnormal findings: Secondary | ICD-10-CM | POA: Diagnosis not present

## 2020-07-03 DIAGNOSIS — G4700 Insomnia, unspecified: Secondary | ICD-10-CM | POA: Diagnosis not present

## 2020-07-03 DIAGNOSIS — K219 Gastro-esophageal reflux disease without esophagitis: Secondary | ICD-10-CM | POA: Diagnosis not present

## 2020-07-03 DIAGNOSIS — Z125 Encounter for screening for malignant neoplasm of prostate: Secondary | ICD-10-CM | POA: Diagnosis not present

## 2020-07-03 DIAGNOSIS — E782 Mixed hyperlipidemia: Secondary | ICD-10-CM | POA: Diagnosis not present

## 2020-07-03 DIAGNOSIS — N529 Male erectile dysfunction, unspecified: Secondary | ICD-10-CM | POA: Diagnosis not present

## 2020-08-15 DIAGNOSIS — R040 Epistaxis: Secondary | ICD-10-CM | POA: Diagnosis not present

## 2020-08-15 DIAGNOSIS — S022XXA Fracture of nasal bones, initial encounter for closed fracture: Secondary | ICD-10-CM | POA: Diagnosis not present

## 2020-08-15 DIAGNOSIS — W01198A Fall on same level from slipping, tripping and stumbling with subsequent striking against other object, initial encounter: Secondary | ICD-10-CM | POA: Diagnosis not present

## 2020-08-15 DIAGNOSIS — S0121XA Laceration without foreign body of nose, initial encounter: Secondary | ICD-10-CM | POA: Diagnosis not present

## 2020-08-25 ENCOUNTER — Ambulatory Visit: Payer: BC Managed Care – PPO | Attending: Internal Medicine

## 2020-08-25 DIAGNOSIS — Z23 Encounter for immunization: Secondary | ICD-10-CM

## 2020-08-25 NOTE — Progress Notes (Signed)
   [id number]  Name:  Jackson Coffield.    MRN: [id number] DOB: [birth year]  08/25/2020  Mr. Molina was observed post Covid-19 immunization for 15 minutes without incident. He was provided with Vaccine Information Sheet and instruction to access the V-Safe system.   Mr. Krolak was instructed to call 911 with any severe reactions post vaccine: Marland Kitchen Difficulty breathing  . Swelling of face and throat  . A fast heartbeat  . A bad rash all over body  . Dizziness and weakness   Immunizations Administered    Name Date Dose VIS Date Route   Pfizer COVID-19 Vaccine 08/25/2020  1:02 PM 0.3 mL 07/11/2020 Intramuscular   Manufacturer: [employer], [employer]   Lot: [id number]   NDC: [address]-1

## 2020-08-29 DIAGNOSIS — S022XXS Fracture of nasal bones, sequela: Secondary | ICD-10-CM | POA: Diagnosis not present

## 2020-08-29 DIAGNOSIS — Z09 Encounter for follow-up examination after completed treatment for conditions other than malignant neoplasm: Secondary | ICD-10-CM | POA: Diagnosis not present

## 2020-10-02 ENCOUNTER — Other Ambulatory Visit: Payer: Self-pay

## 2020-10-02 ENCOUNTER — Encounter: Payer: Self-pay | Admitting: Internal Medicine

## 2020-10-02 ENCOUNTER — Ambulatory Visit: Payer: BC Managed Care – PPO | Admitting: Internal Medicine

## 2020-10-02 VITALS — BP 120/78 | HR 71 | Ht 68.75 in | Wt 178.6 lb

## 2020-10-02 DIAGNOSIS — L4050 Arthropathic psoriasis, unspecified: Secondary | ICD-10-CM | POA: Diagnosis not present

## 2020-10-02 DIAGNOSIS — M1712 Unilateral primary osteoarthritis, left knee: Secondary | ICD-10-CM

## 2020-10-02 DIAGNOSIS — N529 Male erectile dysfunction, unspecified: Secondary | ICD-10-CM | POA: Insufficient documentation

## 2020-10-02 DIAGNOSIS — G4700 Insomnia, unspecified: Secondary | ICD-10-CM | POA: Insufficient documentation

## 2020-10-02 DIAGNOSIS — E782 Mixed hyperlipidemia: Secondary | ICD-10-CM | POA: Insufficient documentation

## 2020-10-02 DIAGNOSIS — K219 Gastro-esophageal reflux disease without esophagitis: Secondary | ICD-10-CM | POA: Insufficient documentation

## 2020-10-02 DIAGNOSIS — E559 Vitamin D deficiency, unspecified: Secondary | ICD-10-CM | POA: Insufficient documentation

## 2020-10-02 MED ORDER — APREMILAST 30 MG PO TABS: 30.0000 mg | ORAL_TABLET | Freq: Two times a day (BID) | ORAL | 1 refills | Status: AC

## 2020-10-02 NOTE — Patient Instructions (Signed)
Apremilast oral tablets What is this medicine? APREMILAST (a PRE mil ast) is used to treat plaque psoriasis, psoriatic arthritis, and certain oral ulcers. This medicine may be used for other purposes; ask your health care provider or pharmacist if you have questions. COMMON BRAND NAME(S): Rutherford Nail What should I tell my health care provider before I take this medicine? They need to know if you have any of these conditions:  dehydration  kidney disease  mental illness  an unusual or allergic reaction to apremilast, other medicines, foods, dyes, or preservatives  pregnant or trying to get pregnant  breast-feeding How should I use this medicine? Take this medicine by mouth with a glass of water. Follow the directions on the prescription label. Do not cut, crush or chew this medicine. You can take it with or without food. If it upsets your stomach, take it with food. Take your medicine at regular intervals. Do not take it more often than directed. Do not stop taking except on your doctor's advice. Talk to your pediatrician regarding the use of this medicine in children. Special care may be needed. Overdosage: If you think you have taken too much of this medicine contact a poison control center or emergency room at once. NOTE: This medicine is only for you. Do not share this medicine with others. What if I miss a dose? If you miss a dose, take it as soon as you can. If it is almost time for your next dose, take only that dose. Do not take double or extra doses. What may interact with this medicine? This medicine may interact with the following medications:  certain medicines for seizures like carbamazepine, phenobarbital, phenytoin  rifampin This list may not describe all possible interactions. Give your health care provider a list of all the medicines, herbs, non-prescription drugs, or dietary supplements you use. Also tell them if you smoke, drink alcohol, or use illegal drugs. Some items  may interact with your medicine. What should I watch for while using this medicine? Tell your doctor or healthcare professional if your symptoms do not start to get better or if they get worse. Patients and their families should watch out for new or worsening depression or thoughts of suicide. Also watch out for sudden changes in feelings such as feeling anxious, agitated, panicky, irritable, hostile, aggressive, impulsive, severely restless, overly excited and hyperactive, or not being able to sleep. If this happens, call your health care professional. Check with your doctor or health care professional if you get an attack of severe diarrhea, nausea and vomiting, or if you sweat a lot. The loss of too much body fluid can make it dangerous for you to take this medicine. What side effects may I notice from receiving this medicine? Side effects that you should report to your doctor or health care professional as soon as possible:  depressed mood  weight loss Side effects that usually do not require medical attention (report to your doctor or health care professional if they continue or are bothersome):  diarrhea  headache  nausea, vomiting This list may not describe all possible side effects. Call your doctor for medical advice about side effects. You may report side effects to FDA at 1-800-FDA-1088. Where should I keep my medicine? Keep out of the reach of children. Store below 30 degrees C (86 degrees F). Throw away any unused medicine after the expiration date.   Psoriatic Arthritis Psoriatic arthritis is a long-term (chronic) condition that causes pain, swelling, and stiffness in the  joints. The large joints of the legs, hips, and pelvis are most often affected. The joints in the neck and back may also be affected. Sometimes psoriatic arthritis can involve joints in the toes, fingers, wrists, and elbows. Most people with psoriatic arthritis have a chronic skin disease that causes itchy  scales and patches to form on the skin (psoriasis) before they develop psoriatic arthritis. In some cases, a person may have psoriatic arthritis before or without having psoriasis. Psoriatic arthritis can be mild or severe. It may come and go or cause symptoms all the time. It may affect one joint, a few joints, or many joints. In severe cases, untreated psoriatic arthritis can cause joint damage. What are the causes? The exact cause of this condition is not known. Psoriatic arthritis is an autoimmune disease. With this type of disease, the body's defense system (immune system) mistakenly attacks healthy tissues. If you have psoriasis, the immune system attacks the skin. With psoriatic arthritis, the immune system attacks joints and the tissues that connect muscles to joints (tendons). The disease may be activated by a trigger, such as an infection or stress. What increases the risk? You are more likely to develop this condition if:  You have psoriasis. This is the biggest risk factor. Most people who develop psoriatic arthritis have had psoriasis for 5 to 10 years.  You have a family history of psoriasis or psoriatic arthritis.  You are between the ages of 36 and 35. What are the signs or symptoms? The main symptom of this condition is inflammation of joints and tendons. Other symptoms may include:  Joint swelling.  Joint pain.  Joint stiffness, especially in the morning.  Swollen fingers and toes.  Pain in areas where tendons connect to bones.  Pain in the heel or sole of the foot.  Pitted and weak nails.  Tiredness (fatigue).  Eye redness.   How is this diagnosed? This condition may be diagnosed based on:  Your symptoms and medical history.  A physical exam.  X-rays to look for joint inflammation or damage, especially in the joints of the pelvis (sacroiliac joints).  Other imaging tests, such as a CT scan or MRI.  Blood tests to look for inflammation and to rule out  other causes of joint inflammation, such as gout or rheumatoid arthritis. How is this treated? The goal of treatment is to reduce pain and inflammation and protect joints from damage. Treatment depends on how severe the inflammation is and how many joints are affected. Treatment may include:  Medicines, such as: ? NSAIDs to relieve pain and inflammation. For people with mild disease, these may be the only medicines needed. ? Disease-modifying antirheumatic drugs (DMARDs). ? Biologic medicines. These may be used for severe inflammation or if other medicines are not working. These medicines are usually given as injections or through an IV. They are very effective for many people with inflammatory arthritis, but there is an increased risk of infection.  Physical therapy and other exercise to strengthen muscles that support joints and to prevent joint stiffness.  A brace or splint to support a painful and swollen joint.  Surgery to reconstruct or replace a joint. This may be an option for people with severe joint damage if other treatments have not helped. Follow these instructions at home: Medicines  Take over-the-counter and prescription medicines only as told by your health care provider.  Be aware of the possible side effects of your medicine and know when to call your health care  provider.  If you are taking a biologic, let your health care provider know if you have signs or symptoms of an infection. You may need to stop treatment until your infection clears up. Managing pain, stiffness, and swelling  If directed, put ice on painful areas. ? Put ice in a plastic bag. ? Place a towel between your skin and the bag. ? Leave the ice on for 20 minutes, 2-3 times a day.   If you have a brace or splint:  Wear the brace or splint as told by your health care provider. Remove it only as told by your health care provider.  Loosen the brace or splint if your fingers or toes tingle, become numb,  or turn cold and blue.  Keep the brace or splint clean.  If the brace or splint is not waterproof: ? Do not let it get wet. ? Cover it with a watertight covering when you take a bath or shower. Activity  Return to your normal activities as told by your health care provider. Ask your health care provider what activities are safe for you.  Get regular exercise. Ask your health care provider what type of exercise is best for you. Your health care provider may recommend: ? Low-impact exercises such as walking, biking, or swimming. ? Exercises that include stretching, such as tai chi and yoga.  Do not exercise when you have a flare of symptoms. Rest until the symptoms improve. Eating and drinking  Do not drink alcohol if you are taking an NSAID. Alcohol and NSAIDs can cause stomach irritation.  Eat a healthy diet that includes plenty of vegetables, fruits, whole grains, low-fat dairy products, and lean protein. Do not eat a lot of foods that are high in solid fats, added sugars, or salt.   General instructions  Do not use any products that contain nicotine or tobacco, such as cigarettes, e-cigarettes, and chewing tobacco. These can make arthritis worse. If you need help quitting, ask your health care provider.  Maintain a healthy weight. A healthy weight will help you stay active and take stress off your joints.  Stay up to date on all immunizations, including the yearly (annual) flu vaccine.  Keep all follow-up visits as told by your health care provider. This is important. Where to find more information  American Academy of Dermatology: http://jones-macias.info/  American College of Rheumatology: www.rheumatology.[place]: www.psoriasis.org Contact a health care provider if:  Your signs and symptoms flare up.  You have side effects from your medicines.  You are taking a biologic and you have a fever or other signs of infection, such as: ? Chills. ? Feeling  tired. ? Cough or sore throat. ? Loss of appetite. Summary  Psoriatic arthritis is an autoimmune disease that causes joint pain, swelling, and stiffness.  Most people with psoriatic arthritis have the skin disease called psoriasis first.  You may have imaging tests and blood tests to help your health care provider diagnose this condition.  The goal of treatment is to reduce pain and inflammation and protect joints from damage.  Medicines can relieve symptoms and prevent joint damage. This information is not intended to replace advice given to you by your health care provider. Make sure you discuss any questions you have with your health care provider. Document Revised: 01/04/2019 Document Reviewed: 05/13/2018 Elsevier Patient Education  2021 [employer].

## 2020-10-02 NOTE — Progress Notes (Signed)
Office Visit Note  Patient: Ronald Norton.             Date of Birth: Mar 09, 1966           MRN: [id number]             PCP: Daisy Floro, MD Referring: Daisy Floro, MD Visit Date: 10/02/2020 Occupation: Curator  Subjective:  New Patient (Initial Visit) (Transfer of care from [facility]. ) and Joint Pain (Patient is currently on Otezla for psoriatic arthritis, he feels as if the pain is pretty well controlled and allows life to be more manageable, but does not completely resolve pain. )   History of Present Illness: Ronald Norton. is a 55 y.o. male with a history of GERD, hyperlipidemia, ED, and insomnia here to establish care for psoriatic arthritis currently on Otezla 30 mg twice daily.  He has a history of skin psoriasis historically reported including the left ear but more recently was evaluated by his dermatologist for some skin rash on the bilateral sides of his trunk treated with topical hydrocortisone.  He has a history of some chronic joint pain including the left knee he attributes to wear and tear type use but since last summer he developed worsening pain in the bilateral shoulders hands hips feet and knees much worse than he has previously experienced.  He was evaluated at [facility] rheumatology in October with laboratory work-up with negative rheumatoid factor and normal inflammatory markers also low vitamin D at 19.3.  Based on clinical picture and testing symptoms thought to represent psoriatic arthritis and was started on [place] he felt a noticeable improvement in joint pain within about 2 weeks of starting the medication.  Since then he is doing pretty well overall though some continued pain for which he is using as needed ibuprofen which is beneficial.  His morning stiffness decreased from all morning last summer to usually less than 1 hour or after taking a hot shower in the morning.   Activities of Daily Living:  Patient reports morning  stiffness for 45 minutes.   Patient Reports nocturnal pain.  Difficulty dressing/grooming: Denies Difficulty climbing stairs: Denies Difficulty getting out of chair: Reports Difficulty using hands for taps, buttons, cutlery, and/or writing: Reports  Review of Systems  Constitutional: Positive for fatigue.  HENT: Negative for mouth sores, mouth dryness and nose dryness.   Eyes: Negative for pain, itching, visual disturbance and dryness.  Respiratory: Negative for cough, hemoptysis, shortness of breath and difficulty breathing.   Cardiovascular: Negative for chest pain, palpitations and swelling in legs/feet.  Gastrointestinal: Negative for abdominal pain, blood in stool, constipation and diarrhea.  Endocrine: Negative for increased urination.  Genitourinary: Negative for painful urination.  Musculoskeletal: Positive for arthralgias, joint pain, muscle weakness and morning stiffness. Negative for joint swelling, myalgias, muscle tenderness and myalgias.  Skin: Negative for color change, rash and redness.  Allergic/Immunologic: Negative for susceptible to infections.  Neurological: Negative for dizziness, numbness, headaches, memory loss and weakness.  Hematological: Negative for swollen glands.  Psychiatric/Behavioral: Positive for sleep disturbance. Negative for confusion.    PMFS History:  Patient Active Problem List   Diagnosis Date Noted  . Psoriatic arthritis (HCC) 10/02/2020  . ED (erectile dysfunction) of organic origin 10/02/2020  . Gastroesophageal reflux disease 10/02/2020  . Vitamin D deficiency 10/02/2020  . Mixed hyperlipidemia 10/02/2020  . Insomnia 10/02/2020  . Osteoarthritis of left knee 10/02/2020    Past Medical History:  Diagnosis Date  .  High cholesterol   . Psoriatic arthritis (HCC)     Family History  Problem Relation Age of Onset  . Arthritis Mother        degenerative disc disease  . Hypertension Father   . Arthritis/Rheumatoid Father   . Diabetes  Maternal Uncle   . Diabetes Paternal Uncle    Past Surgical History:  Procedure Laterality Date  . BACK SURGERY     lumbar disk and fusion   Social History   Social History Narrative   Lives home with wife, cheryl.  Is self employed.  Education AD.  Children one.    Caffeine oz at lunch.     Immunization History  Administered Date(s) Administered  . PFIZER SARS-COV-2 Vaccination 12/01/2019, 12/26/2019, 08/25/2020     Objective: Vital Signs: BP 120/78 (BP Location: Left Arm, Patient Position: Sitting, Cuff Size: Normal)   Pulse 71   Ht 5' 8.75" (1.746 m)   Wt 178 lb 9.6 oz (81 kg)   BMI 26.57 kg/m    Physical Exam HENT:     Head: Normocephalic.     Right Ear: External ear normal.     Left Ear: External ear normal.     Mouth/Throat:     Mouth: Mucous membranes are moist.     Pharynx: Oropharynx is clear.  Cardiovascular:     Rate and Rhythm: Normal rate and regular rhythm.  Pulmonary:     Effort: Pulmonary effort is normal.     Breath sounds: Normal breath sounds.  Skin:    General: Skin is warm and dry.     Findings: No rash.  Neurological:     General: No focal deficit present.     Mental Status: He is alert.  Psychiatric:        Mood and Affect: Mood normal.     Musculoskeletal Exam:  Neck full range of motion no tenderness Shoulder, elbow, wrist, fingers full range of motion, shoulder stiffness with overhead abduction, no tenderness or swelling Normal hip internal and external rotation without pain, no pain with Pace or FABER maneuver, no swelling L > R patellofemoral crepitus Normal ankle ROM, no MTP swelling   Investigation: No additional findings.  Imaging: No results found.  Recent Labs: No results found for: WBC, HGB, PLT, NA, K, CL, CO2, GLUCOSE, BUN, CREATININE, BILITOT, ALKPHOS, AST, ALT, PROT, ALBUMIN, CALCIUM, GFRAA, QFTBGOLD, QFTBGOLDPLUS  Speciality Comments: No specialty comments available.  Procedures:  No procedures  performed Allergies: Patient has no known allergies.   Assessment / Plan:     Visit Diagnoses: Psoriatic arthritis (HCC) - Plan: Apremilast 30 MG TABS  Skin disease looks very well controlled currently with otezla and topical steroid. No active synovitis or dactylitis seen on exam and inflammatory markers were not substantially elevated even before treatment so probably not useful to track activity. No evidence of erosive disease. Will continue otezla 30mg  BID f/u in 70mos or as needed.  Osteoarthrits of left knee  Pain with use and crepitus on exam but has normal ROM and no swelling warmth or erythema to indicate very active inflammation. Recommended continued use of NSAIDs PRN for pain.  Orders: No orders of the defined types were placed in this encounter.  Meds ordered this encounter  Medications  . Apremilast 30 MG TABS    Sig: Take 1 tablet (30 mg total) by mouth in the morning and at bedtime.    Dispense:  180 tablet    Refill:  1     Follow-Up  Instructions: Return in about 6 months (around 04/01/2021).   Fuller Plan, MD  Note - This record has been created using [employer].  Chart creation errors have been sought, but may not always  have been located. Such creation errors do not reflect on  the standard of medical care.

## 2020-11-12 IMAGING — MR MR ABDOMEN WO/W CM
17 of 18 series · 47 of 48 positions shown · IV contrast (16ml multihance)
Comparison: No prior abdominal MRI. Abdominal ultrasound
09/08/2019.

CLINICAL DATA: 53-year-old male with history of abdominal
discomfort and nausea.

EXAM:
MRI ABDOMEN WITHOUT AND WITH CONTRAST
TECHNIQUE: Multiplanar multisequence MR imaging of the abdomen was performed
both before and after the administration of intravenous contrast.
CONTRAST:  16mL MULTIHANCE GADOBENATE DIMEGLUMINE 529 MG/ML IV SOLN

[Series 3: T2 · coronal · 5.0mm · 0.78mm/px · 1 of 16 slices shown (1 of 3)]
[im 1/16]
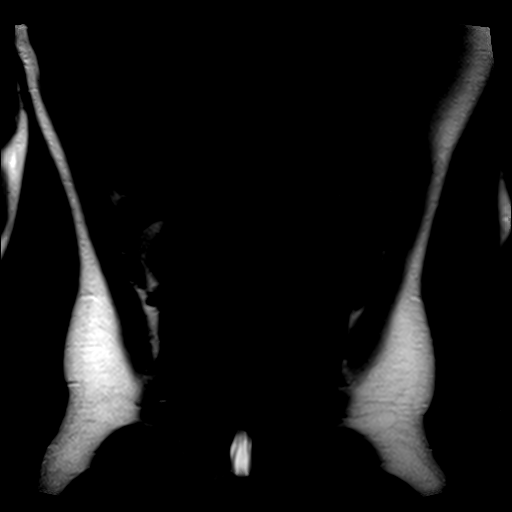

[Series 4: T2 · axial · 5.0mm · 0.66mm/px · 1 of 23 slices shown (2 of 3)]
[im 1/23]
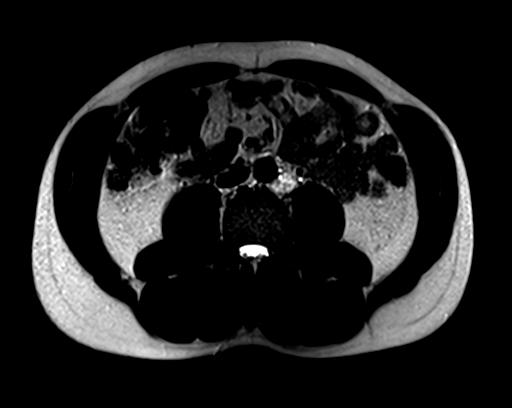

[Series 5: ep2d_diff_b50_500_800_p2_trig · axial · 6.0mm · 1.98mm/px · z∈[-86,+173]mm · 6 of 111 slices shown]
[im 1/111]
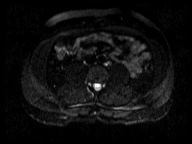
[im 23/111]
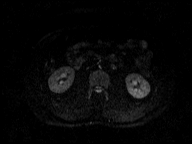
[im 45/111]
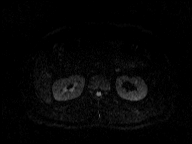
[im 67/111]
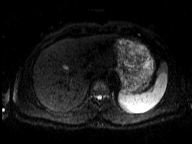
[im 89/111]
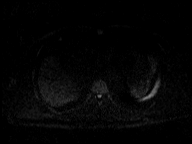
[im 111/111]
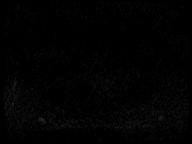

[Series 6: ep2d_diff_b50_500_800_p2_trig_adc · axial · 6.0mm · 1.98mm/px · z∈[-86,+173]mm · 2 of 37 slices shown]
[im 1/37]
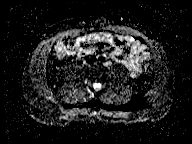
[im 37/37]
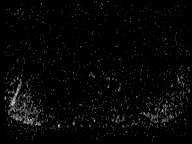

[Series 7: T2 · axial · 6.0mm · 1.12mm/px · z∈[-86,+173]mm · 2 of 37 slices shown (3 of 3)]
[im 1/37]
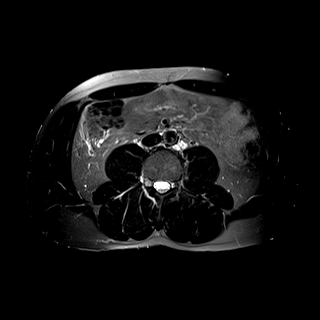
[im 37/37]
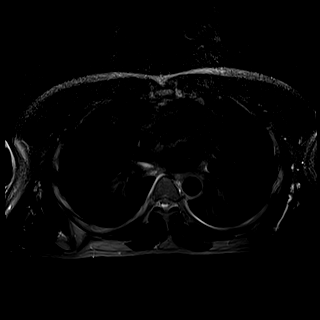

[Series 8: bSSFP · axial · 4.0mm · 0.74mm/px · z∈[-81,+67]mm · 2 of 38 slices shown]
[im 1/38]
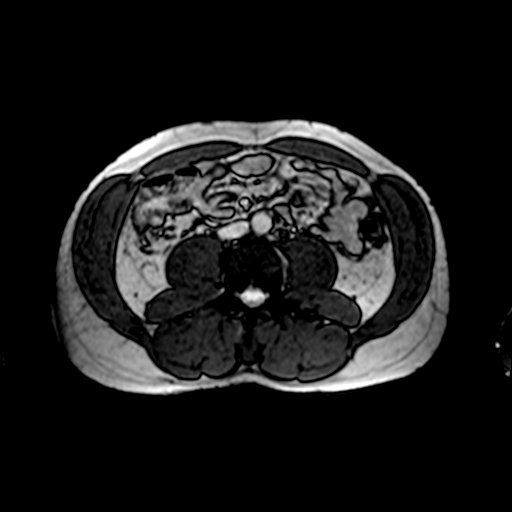
[im 38/38]
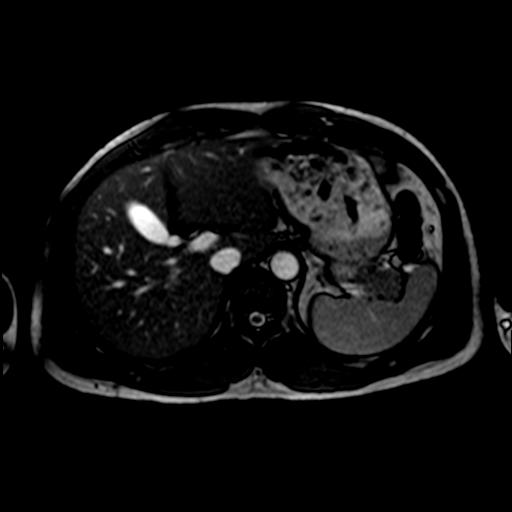

[Series 9: T1 · axial · 5.0mm · 0.66mm/px · z∈[-76,+62]mm · 3 of 48 slices shown]
[im 1/48]
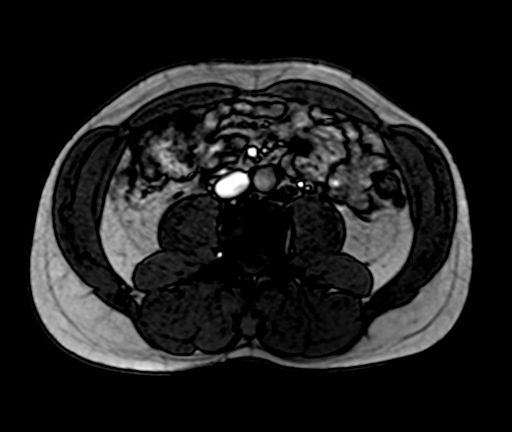
[im 24/48]
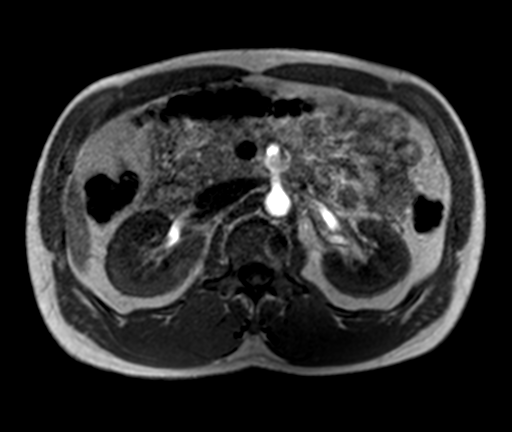
[im 48/48]
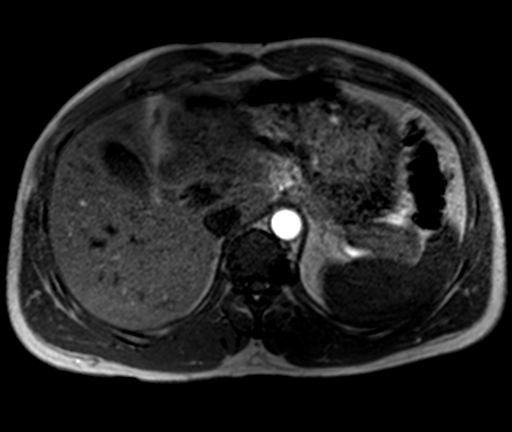

[Series 10: T1 dynamic · axial · non-contrast · 2.3mm · 1.41mm/px · z∈[-79,+57]mm · 3 of 60 slices shown]
[im 1/60]
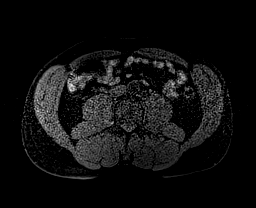
[im 30/60]
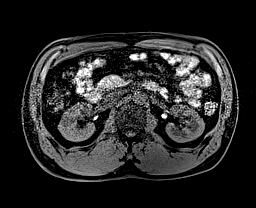
[im 60/60]
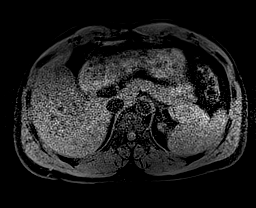

[Series 11: post 25 sec · axial · 2.3mm · 1.41mm/px · z∈[-79,+57]mm · 3 of 60 slices shown]
[im 1/60]
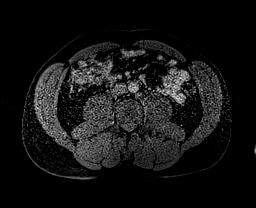
[im 30/60]
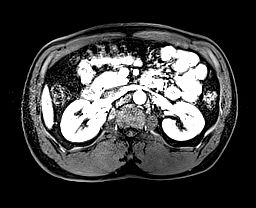
[im 60/60]
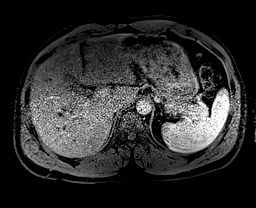

[Series 12: post 25 sec_sub · axial · 2.3mm · 1.41mm/px · z∈[-79,+57]mm · 3 of 60 slices shown]
[im 1/60]
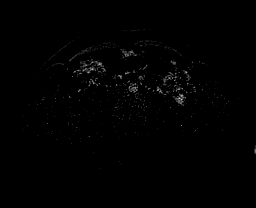
[im 30/60]
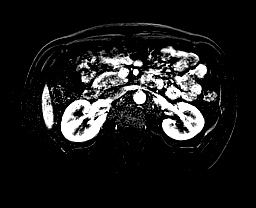
[im 60/60]
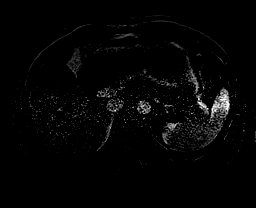

[Series 13: post 45 sec · axial · 2.3mm · 1.41mm/px · z∈[-79,+57]mm · 3 of 60 slices shown]
[im 1/60]
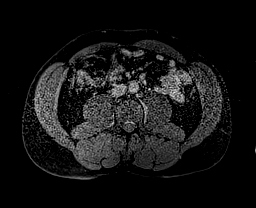
[im 30/60]
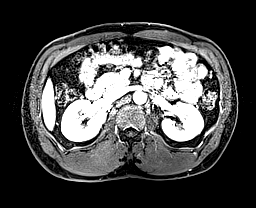
[im 60/60]
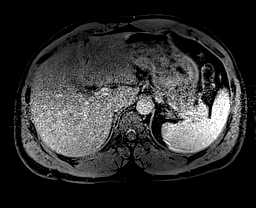

[Series 14: post 45 sec_sub · axial · 2.3mm · 1.41mm/px · z∈[-79,+57]mm · 3 of 60 slices shown]
[im 1/60]
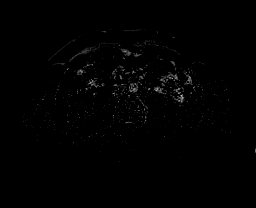
[im 30/60]
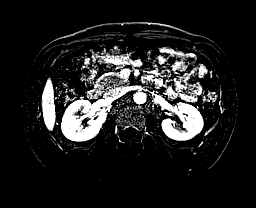
[im 60/60]
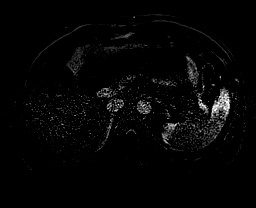

[Series 15: post 90 sec · axial · 2.3mm · 1.41mm/px · z∈[-79,+57]mm · 3 of 60 slices shown]
[im 1/60]
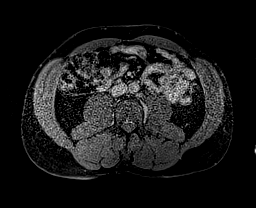
[im 30/60]
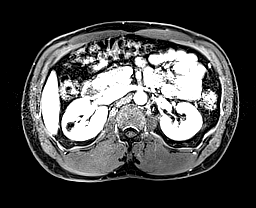
[im 60/60]
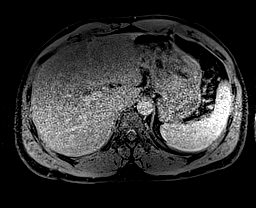

[Series 16: post 90 sec_sub · axial · 2.3mm · 1.41mm/px · z∈[-79,+57]mm · 3 of 60 slices shown]
[im 1/60]
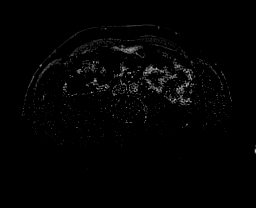
[im 30/60]
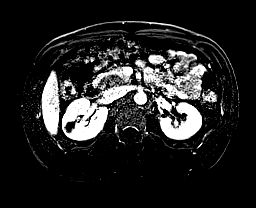
[im 60/60]
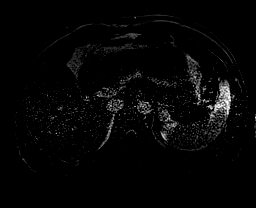

[Series 17: T1 dynamic post-contrast · coronal · 2.6mm · 0.78mm/px · 3 of 52 slices shown]
[im 1/52]
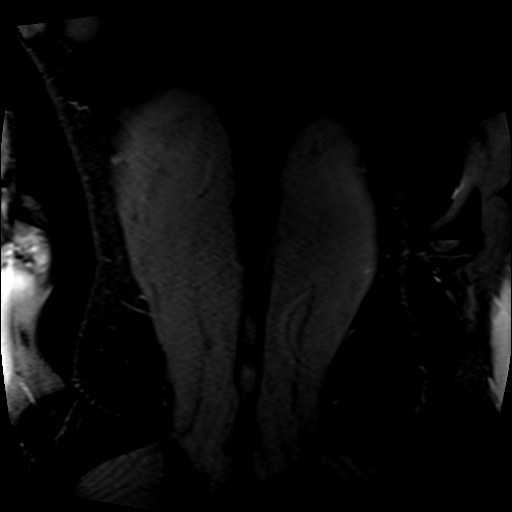
[im 26/52]
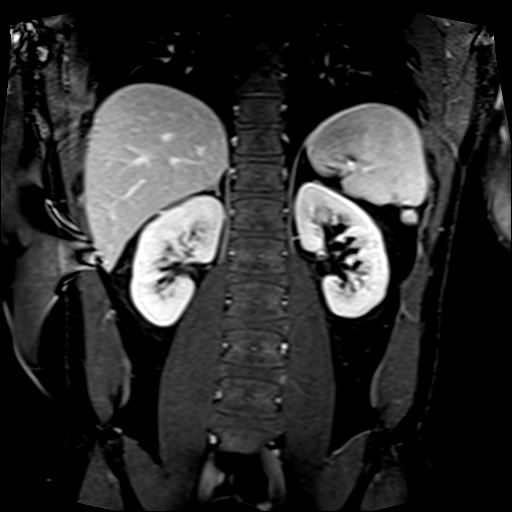
[im 52/52]
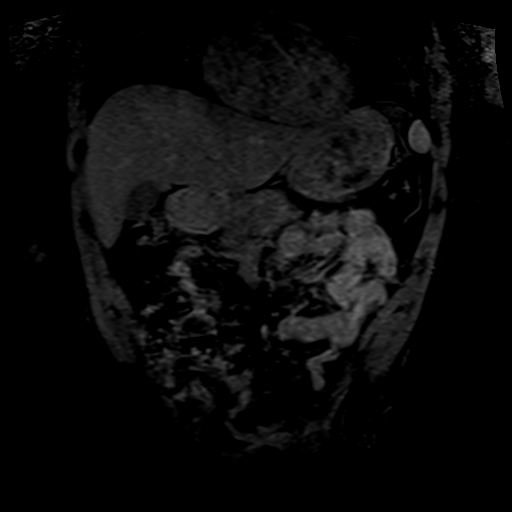

[Series 18: post axial 3+ · axial · 2.3mm · 1.41mm/px · z∈[-79,+57]mm · 3 of 60 slices shown (1 of 2)]
[im 1/60]
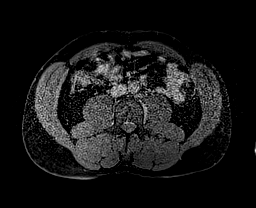
[im 30/60]
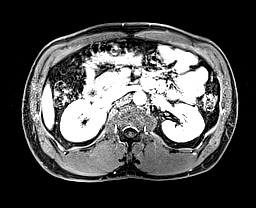
[im 60/60]
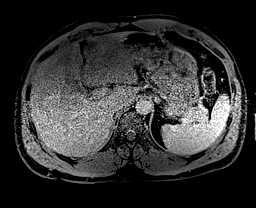

[Series 19: post axial 3+ · axial · 2.3mm · 1.41mm/px · z∈[-79,+57]mm · 3 of 60 slices shown (2 of 2)]
[im 1/60]
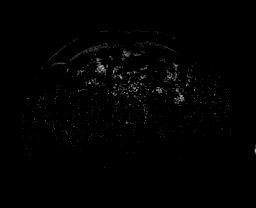
[im 30/60]
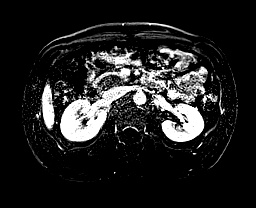
[im 60/60]
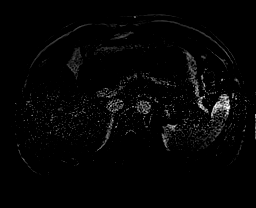

[47 of 48 positions shown; findings below may reference images not displayed]

FINDINGS: Lower chest: Unremarkable.

Hepatobiliary: Subcentimeter T1 hypointense, T2 hyperintense,
nonenhancing lesion noted in segment 7 of the liver, compatible with
a small cyst or biliary hamartoma. No other aggressive appearing
hepatic lesions. No intra or extrahepatic biliary ductal dilatation.
Gallbladder is normal in appearance. Common bile duct is normal in
caliber measuring 3 mm in the porta hepatis.

Pancreas: No pancreatic mass. No pancreatic ductal dilatation. No
pancreatic or peripancreatic fluid collections or inflammatory
changes.

Spleen: Small splenule adjacent to the spleen. Otherwise,
unremarkable.

Adrenals/Urinary Tract: 1.9 cm T1 hypointense, T2 hyperintense,
nonenhancing lesion in the interpolar region of the right kidney,
compatible with a simple cyst. No suspicious renal lesions. No
hydroureteronephrosis in the visualized portions of the abdomen.
Bilateral adrenal glands are normal in appearance.

Stomach/Bowel: Visualized portions are unremarkable.

Vascular/Lymphatic: No aneurysm identified in the visualized
abdominal vasculature. No lymphadenopathy noted in the abdomen.

Other: No significant volume of ascites noted in the visualized
portions of the peritoneal cavity.

Musculoskeletal: No aggressive appearing osseous lesions are noted
in the visualized portions of the skeleton.
IMPRESSION: 1. No acute findings are noted abdomen to account for the patient's
symptoms.
2. 1.9 cm simple cyst in the interpolar region of the right kidney.
3. Additional incidental findings, as above.

## 2021-02-05 ENCOUNTER — Ambulatory Visit: Payer: BC Managed Care – PPO | Admitting: Family Medicine

## 2021-02-08 ENCOUNTER — Encounter: Payer: Self-pay | Admitting: Family Medicine

## 2021-02-08 ENCOUNTER — Ambulatory Visit: Payer: Self-pay

## 2021-02-08 ENCOUNTER — Ambulatory Visit (INDEPENDENT_AMBULATORY_CARE_PROVIDER_SITE_OTHER): Payer: BC Managed Care – PPO | Admitting: Family Medicine

## 2021-02-08 ENCOUNTER — Other Ambulatory Visit: Payer: Self-pay

## 2021-02-08 DIAGNOSIS — M25561 Pain in right knee: Secondary | ICD-10-CM | POA: Diagnosis not present

## 2021-02-08 DIAGNOSIS — M25562 Pain in left knee: Secondary | ICD-10-CM | POA: Diagnosis not present

## 2021-02-08 MED ORDER — CELECOXIB 200 MG PO CAPS: 200.0000 mg | ORAL_CAPSULE | Freq: Two times a day (BID) | ORAL | 6 refills | Status: DC | PRN

## 2021-02-08 NOTE — Patient Instructions (Signed)
  For knees:  Glucosamine Sulfate:  1,000 mg twice daily  Turmeric:  500 mg twice daily  Minimize intake of sugar.

## 2021-02-08 NOTE — Progress Notes (Signed)
Office Visit Note   Patient: Ronald Norton.           Date of Birth: 04-Jul-1966           MRN: [id number] Visit Date: 02/08/2021 Requested by: Daisy Floro, MD [address] [place],  [place] [address] PCP: Daisy Floro, MD  Subjective: Chief Complaint  Patient presents with  . Right Knee - Pain  . Left Knee - Pain    HPI: He is here with left greater than right knee pain.  Symptoms started a while ago, but in the past couple months it has become constant.  He has a history of psoriatic arthritis affecting his hands and feet, never his knees.  He cannot recall any trauma to his knees.  They both hurt on the medial aspect, especially when squatting and kneeling.  No locking or giving way.  He has tried ibuprofen but it no longer seems to be working.  He has history of vitamin D deficiency but has not taking anything for that.                ROS:   All other systems were reviewed and are negative.  Objective: Vital Signs: There were no vitals taken for this visit.  Physical Exam:  General:  Alert and oriented, in no acute distress. Pulm:  Breathing unlabored. Psy:  Normal mood, congruent affect. Skin: No rash over his knees Knees: He has trace patellofemoral crepitus.  Trace effusion bilaterally.  Ligaments feel stable, full range of motion.  Both knees are tender on the medial joint line and he has pain but no palpable click with McMurray's.  Imaging: XR Knee 1-2 Views Left  Result Date: 02/08/2021 X-rays of both knees reveal mild medial compartment joint space narrowing and periarticular spurring, mild patellofemoral spurring.  No sign of loose body.  XR Knee 1-2 Views Right  Result Date: 02/08/2021 X-rays of both knees reveal mild medial compartment joint space narrowing and periarticular spurring, mild patellofemoral spurring.  No sign of loose body.   Assessment & Plan: 1.  Bilateral knee pain, left greater than right.  Possibilities include  osteoarthritis, psoriatic arthritis, degenerative medial meniscus tear. -Discussed options and elected to try Celebrex as needed, glucosamine and turmeric, minimize intake of sugar.  Optimize vitamin D level. -If symptoms persist could contemplate a one-time cortisone injection or possibly MRI scan.     Procedures: No procedures performed        PMFS History: Patient Active Problem List   Diagnosis Date Noted  . Psoriatic arthritis (HCC) 10/02/2020  . ED (erectile dysfunction) of organic origin 10/02/2020  . Gastroesophageal reflux disease 10/02/2020  . Vitamin D deficiency 10/02/2020  . Mixed hyperlipidemia 10/02/2020  . Insomnia 10/02/2020  . Osteoarthritis of left knee 10/02/2020   Past Medical History:  Diagnosis Date  . High cholesterol   . Psoriatic arthritis (HCC)     Family History  Problem Relation Age of Onset  . Arthritis Mother        degenerative disc disease  . Hypertension Father   . Arthritis/Rheumatoid Father   . Diabetes Maternal Uncle   . Diabetes Paternal Uncle     Past Surgical History:  Procedure Laterality Date  . BACK SURGERY     lumbar disk and fusion   Social History   Occupational History  . Not on file  Tobacco Use  . Smoking status: Never Smoker  . Smokeless tobacco: Never Used  Vaping Use  .  Vaping Use: Former  Substance and Sexual Activity  . Alcohol use: Yes    Comment: occ  . Drug use: No  . Sexual activity: Not on file

## 2021-02-08 NOTE — Progress Notes (Signed)
Bilateral knee pain Left is worse than right  Pain for 2-3 months No known injury No past sx No PT No injections Tried ibu

## 2021-03-31 NOTE — Progress Notes (Signed)
Office Visit Note  Patient: Ronald Norton.             Date of Birth: 11-Mar-1966           MRN: [id number]             PCP: Daisy Floro, MD Referring: Daisy Floro, MD Visit Date: 04/01/2021   Subjective:  Follow-up (Patient discontinued Otezla and Celebrex due to side effects, patient was feeling nauseous and had loss of appetite. Patient notices increase in overall joint aches since discontinuing medication. )   History of Present Illness: Ronald Norton. is a 55 y.o. male here for follow up for psoriatic arthritis previously on Otezla 30 mg twice daily but discontinued this medicine since about 3 months ago due to increased nausea and loss of appetite.  He weaned himself off the medication by half before discontinuing since stopping it he noticed improvement with his GI symptoms.  He also experienced nausea symptoms when prescribed Celebrex for left knee osteoarthritis so did not tolerate continuing this either.  He is taking chondroitin supplement and turmeric which are providing a small benefit to his knee symptoms currently.  Since he stopped the medicine he feels that about half the time there is generalized increase in joint pains but these are manageable and improved with activity and moving.  He has noticed some skin rashes breaking out around the ears and around the groin but these improved with topical treatment.  Previous HPI: 10/02/20 Ronald Norton. is a 55 y.o. male with a history of GERD, hyperlipidemia, ED, and insomnia here to establish care for psoriatic arthritis currently on Otezla 30 mg twice daily.  He has a history of skin psoriasis historically reported including the left ear but more recently was evaluated by his dermatologist for some skin rash on the bilateral sides of his trunk treated with topical hydrocortisone.  He has a history of some chronic joint pain including the left knee he attributes to wear and tear type use but since last  summer he developed worsening pain in the bilateral shoulders hands hips feet and knees much worse than he has previously experienced.  He was evaluated at [facility] rheumatology in October with laboratory work-up with negative rheumatoid factor and normal inflammatory markers also low vitamin D at 19.3.  Based on clinical picture and testing symptoms thought to represent psoriatic arthritis and was started on [place] he felt a noticeable improvement in joint pain within about 2 weeks of starting the medication.  Since then he is doing pretty well overall though some continued pain for which he is using as needed ibuprofen which is beneficial.  His morning stiffness decreased from all morning last summer to usually less than 1 hour or after taking a hot shower in the morning.   Review of Systems  Constitutional:  Positive for fatigue.  HENT:  Negative for mouth sores, mouth dryness and nose dryness.   Eyes:  Negative for pain, itching, visual disturbance and dryness.  Respiratory:  Negative for cough, hemoptysis, shortness of breath and difficulty breathing.   Cardiovascular:  Negative for chest pain, palpitations and swelling in legs/feet.  Gastrointestinal:  Negative for abdominal pain, blood in stool, constipation and diarrhea.  Endocrine: Negative for increased urination.  Genitourinary:  Negative for painful urination.  Musculoskeletal:  Positive for joint pain, joint pain and morning stiffness. Negative for joint swelling, myalgias, muscle weakness, muscle tenderness and myalgias.  Skin:  Negative for color  change, rash and redness.  Allergic/Immunologic: Negative for susceptible to infections.  Neurological:  Negative for dizziness, numbness, headaches, memory loss and weakness.  Hematological:  Negative for swollen glands.  Psychiatric/Behavioral:  Negative for confusion and sleep disturbance.    PMFS History:  Patient Active Problem List   Diagnosis Date Noted   Psoriatic arthritis  (HCC) 10/02/2020   ED (erectile dysfunction) of organic origin 10/02/2020   Gastroesophageal reflux disease 10/02/2020   Vitamin D deficiency 10/02/2020   Mixed hyperlipidemia 10/02/2020   Insomnia 10/02/2020   Osteoarthritis of left knee 10/02/2020    Past Medical History:  Diagnosis Date   High cholesterol    Psoriatic arthritis (HCC)     Family History  Problem Relation Age of Onset   Arthritis Mother        degenerative disc disease   Hypertension Father    Arthritis/Rheumatoid Father    Diabetes Maternal Uncle    Diabetes Paternal Uncle    Past Surgical History:  Procedure Laterality Date   BACK SURGERY     lumbar disk and fusion   Social History   Social History Narrative   Lives home with wife, cheryl.  Is self employed.  Education AD.  Children one.    Caffeine oz at lunch.     Immunization History  Administered Date(s) Administered   PFIZER(Purple Top)SARS-COV-2 Vaccination 12/01/2019, 12/26/2019, 08/25/2020     Objective: Vital Signs: BP 125/76 (BP Location: Left Arm, Patient Position: Sitting, Cuff Size: Normal)   Pulse 84   Ht 5\' 9"  (1.753 m)   Wt 177 lb (80.3 kg)   BMI 26.14 kg/m    Physical Exam HENT:     Right Ear: External ear normal.     Left Ear: External ear normal.  Skin:    General: Skin is warm and dry.     Findings: No rash.  Neurological:     Mental Status: He is alert.  Psychiatric:        Mood and Affect: Mood normal.     Musculoskeletal Exam:  Shoulders full ROM no tenderness or swelling Elbows full ROM no tenderness or swelling Wrists full ROM no tenderness or swelling Fingers full ROM no tenderness or swelling Knees bilateral patellofemoral crepitus, slight decreased left knee extension range of motion and no effusions Ankles full ROM no tenderness or swelling   Investigation: No additional findings.  Imaging: No results found.  Recent Labs: No results found for: WBC, HGB, PLT, NA, K, CL, CO2, GLUCOSE, BUN,  CREATININE, BILITOT, ALKPHOS, AST, ALT, PROT, ALBUMIN, CALCIUM, GFRAA, QFTBGOLD, QFTBGOLDPLUS  Speciality Comments: No specialty comments available.  Procedures:  No procedures performed Allergies: Patient has no known allergies.   Assessment / Plan:     Visit Diagnoses: Psoriatic arthritis (HCC)  History of psoriatic arthritis previously on but he is off treatment there is no obvious synovitis joint tenderness or skin disease activity on exam today.  No evidence of erosive disease on previous imaging.  We discussed this we will plan to just observe for now he is taking as needed anti-inflammatories his symptoms historically are worse during the winter so recommend follow-up at 6 months or as needed if significant exacerbation before then.  Primary osteoarthritis of left knee  Left knee osteoarthritis followed with orthopedics currently managing conservatively and has had some improvement.  Not currently planning but has discussed options including arthroscopy.  Gastroesophageal reflux disease, unspecified whether esophagitis present  History of GERD question if this could be the  primary cause of his nausea and loss of appetite symptoms especially with NSAID use involved.  Symptoms are currently improved.  Orders: No orders of the defined types were placed in this encounter.  No orders of the defined types were placed in this encounter.    Follow-Up Instructions: Return in about 6 months (around 10/02/2021) for PsA obs/NSAIDs f/u 37mos.   Fuller Plan, MD  Note - This record has been created using [employer].  Chart creation errors have been sought, but may not always  have been located. Such creation errors do not reflect on  the standard of medical care.

## 2021-04-01 ENCOUNTER — Other Ambulatory Visit: Payer: Self-pay

## 2021-04-01 ENCOUNTER — Encounter: Payer: Self-pay | Admitting: Internal Medicine

## 2021-04-01 ENCOUNTER — Ambulatory Visit: Payer: BC Managed Care – PPO | Admitting: Internal Medicine

## 2021-04-01 VITALS — BP 125/76 | HR 84 | Ht 69.0 in | Wt 177.0 lb

## 2021-04-01 DIAGNOSIS — M1712 Unilateral primary osteoarthritis, left knee: Secondary | ICD-10-CM

## 2021-04-01 DIAGNOSIS — K219 Gastro-esophageal reflux disease without esophagitis: Secondary | ICD-10-CM

## 2021-04-01 DIAGNOSIS — L4050 Arthropathic psoriasis, unspecified: Secondary | ICD-10-CM | POA: Diagnosis not present

## 2021-05-01 ENCOUNTER — Encounter: Payer: Self-pay | Admitting: Family Medicine

## 2021-05-07 ENCOUNTER — Ambulatory Visit: Payer: BC Managed Care – PPO | Admitting: Family Medicine

## 2021-05-07 ENCOUNTER — Other Ambulatory Visit: Payer: Self-pay

## 2021-05-07 ENCOUNTER — Encounter: Payer: Self-pay | Admitting: Family Medicine

## 2021-05-07 DIAGNOSIS — M25562 Pain in left knee: Secondary | ICD-10-CM

## 2021-05-07 NOTE — Progress Notes (Signed)
   Office Visit Note   Patient: Ronald Norton.           Date of Birth: 09/19/1966           MRN: [id number] Visit Date: 05/07/2021 Requested by: Daisy Floro, MD [address] [place],  [place] [address] PCP: Daisy Floro, MD  Subjective: Chief Complaint  Patient presents with   Left Knee - Pain, Follow-up    Planned cortisone injection    HPI: He is here with worsening left knee pain.  Pain constantly for the past 2 weeks, walking with a limp.  Pain is mostly on the medial aspect.              ROS:   All other systems were reviewed and are negative.  Objective: Vital Signs: There were no vitals taken for this visit.  Physical Exam:  General:  Alert and oriented, in no acute distress. Pulm:  Breathing unlabored. Psy:  Normal mood, congruent affect. Skin: No erythema Left knee: No effusion.  Full range of motion.  Tender at the medial patellofemoral joint and on the medial joint line.    Imaging: No results found.  Assessment & Plan: Left knee pain, possibly due to arthritis versus medial meniscus injury -Elected to inject with cortisone.  If no improvement, then MRI scan.     Procedures: Left knee injection: After sterile prep with Betadine, injected 3 cc 1% lidocaine without epinephrine and 40 mg Depo-Medrol from medial midpatellar approach.       PMFS History: Patient Active Problem List   Diagnosis Date Noted   Psoriatic arthritis (HCC) 10/02/2020   ED (erectile dysfunction) of organic origin 10/02/2020   Gastroesophageal reflux disease 10/02/2020   Vitamin D deficiency 10/02/2020   Mixed hyperlipidemia 10/02/2020   Insomnia 10/02/2020   Osteoarthritis of left knee 10/02/2020   Past Medical History:  Diagnosis Date   High cholesterol    Psoriatic arthritis (HCC)     Family History  Problem Relation Age of Onset   Arthritis Mother        degenerative disc disease   Hypertension Father    Arthritis/Rheumatoid Father     Diabetes Maternal Uncle    Diabetes Paternal Uncle     Past Surgical History:  Procedure Laterality Date   BACK SURGERY     lumbar disk and fusion   Social History   Occupational History   Not on file  Tobacco Use   Smoking status: Never   Smokeless tobacco: Never  Vaping Use   Vaping Use: Former  Substance and Sexual Activity   Alcohol use: Yes    Comment: Occasionally   Drug use: No   Sexual activity: Not on file

## 2021-06-28 ENCOUNTER — Ambulatory Visit: Payer: BC Managed Care – PPO | Attending: Internal Medicine

## 2021-06-28 DIAGNOSIS — Z23 Encounter for immunization: Secondary | ICD-10-CM

## 2021-06-28 NOTE — Progress Notes (Signed)
   [id number]  Name:  Ronald Norton.    MRN: [id number] DOB: 08/17/1966  06/28/2021  Mr. Ronald Norton was observed post Covid-19 immunization for 15 minutes without incident. He was provided with Vaccine Information Sheet and instruction to access the V-Safe system.   Mr. Ronald Norton was instructed to call 911 with any severe reactions post vaccine: Difficulty breathing  Swelling of face and throat  A fast heartbeat  A bad rash all over body  Dizziness and weakness

## 2021-07-09 ENCOUNTER — Other Ambulatory Visit (HOSPITAL_BASED_OUTPATIENT_CLINIC_OR_DEPARTMENT_OTHER): Payer: Self-pay

## 2021-07-09 MED ORDER — COVID-19MRNA BIVAL VACC PFIZER 30 MCG/0.3ML IM SUSP
INTRAMUSCULAR | 0 refills | Status: AC
  Filled 2021-07-09: qty 0.3, 1d supply, fill #0

## 2021-07-23 DIAGNOSIS — F329 Major depressive disorder, single episode, unspecified: Secondary | ICD-10-CM | POA: Diagnosis not present

## 2021-07-23 DIAGNOSIS — Z0000 Encounter for general adult medical examination without abnormal findings: Secondary | ICD-10-CM | POA: Diagnosis not present

## 2021-07-23 DIAGNOSIS — Z125 Encounter for screening for malignant neoplasm of prostate: Secondary | ICD-10-CM | POA: Diagnosis not present

## 2021-07-23 DIAGNOSIS — G4700 Insomnia, unspecified: Secondary | ICD-10-CM | POA: Diagnosis not present

## 2021-07-23 DIAGNOSIS — Z13220 Encounter for screening for lipoid disorders: Secondary | ICD-10-CM | POA: Diagnosis not present

## 2021-07-23 DIAGNOSIS — R5381 Other malaise: Secondary | ICD-10-CM | POA: Diagnosis not present

## 2021-07-23 DIAGNOSIS — Z23 Encounter for immunization: Secondary | ICD-10-CM | POA: Diagnosis not present

## 2021-07-23 DIAGNOSIS — L4050 Arthropathic psoriasis, unspecified: Secondary | ICD-10-CM | POA: Diagnosis not present

## 2021-07-23 DIAGNOSIS — E559 Vitamin D deficiency, unspecified: Secondary | ICD-10-CM | POA: Diagnosis not present

## 2021-10-01 NOTE — Progress Notes (Deleted)
Office Visit Note  Patient: Ronald Norton.             Date of Birth: [birth year]           MRN: [id number]             PCP: Daisy Floro, MD Referring: Daisy Floro, MD Visit Date: 10/02/2021   Subjective:  No chief complaint on file.   History of Present Illness: Ronald Marsden. is a 56 y.o. male here for follow up for psoriatic arthritis previously on otezla stopped for GI symptoms planning to treat as needed with NSAIDs for mild joint pains and also knee osteoarthritis. He had low skin disease activity last visit with plan to follow up again in winter weather. ***  Previous HPI 04/01/21 Ronald Cape. is a 56 y.o. male here for follow up for psoriatic arthritis previously on Otezla 30 mg twice daily but discontinued this medicine since about 3 months ago due to increased nausea and loss of appetite.  He weaned himself off the medication by half before discontinuing since stopping it he noticed improvement with his GI symptoms.  He also experienced nausea symptoms when prescribed Celebrex for left knee osteoarthritis so did not tolerate continuing this either.  He is taking chondroitin supplement and turmeric which are providing a small benefit to his knee symptoms currently.  Since he stopped the medicine he feels that about half the time there is generalized increase in joint pains but these are manageable and improved with activity and moving.  He has noticed some skin rashes breaking out around the ears and around the groin but these improved with topical treatment.   Previous HPI: 10/02/20 Ronald Cape. is a 56 y.o. male with a history of GERD, hyperlipidemia, ED, and insomnia here to establish care for psoriatic arthritis currently on Otezla 30 mg twice daily.  He has a history of skin psoriasis historically reported including the left ear but more recently was evaluated by his dermatologist for some skin rash on the bilateral sides of his trunk treated  with topical hydrocortisone.  He has a history of some chronic joint pain including the left knee he attributes to wear and tear type use but since last summer he developed worsening pain in the bilateral shoulders hands hips feet and knees much worse than he has previously experienced.  He was evaluated at [facility] rheumatology in October with laboratory work-up with negative rheumatoid factor and normal inflammatory markers also low vitamin D at 19.3.  Based on clinical picture and testing symptoms thought to represent psoriatic arthritis and was started on [place] he felt a noticeable improvement in joint pain within about 2 weeks of starting the medication.  Since then he is doing pretty well overall though some continued pain for which he is using as needed ibuprofen which is beneficial.  His morning stiffness decreased from all morning last summer to usually less than 1 hour or after taking a hot shower in the morning.   No Rheumatology ROS completed.   PMFS History:  Patient Active Problem List   Diagnosis Date Noted   Psoriatic arthritis (HCC) 10/02/2020   ED (erectile dysfunction) of organic origin 10/02/2020   Gastroesophageal reflux disease 10/02/2020   Vitamin D deficiency 10/02/2020   Mixed hyperlipidemia 10/02/2020   Insomnia 10/02/2020   Osteoarthritis of left knee 10/02/2020    Past Medical History:  Diagnosis Date   High cholesterol    Psoriatic  arthritis ([place])     Family History  Problem Relation Age of Onset   Arthritis Mother        degenerative disc disease   Hypertension Father    Arthritis/Rheumatoid Father    Diabetes Maternal Uncle    Diabetes Paternal Uncle    Past Surgical History:  Procedure Laterality Date   BACK SURGERY     lumbar disk and fusion   Social History   Social History Narrative   Lives home with wife, cheryl.  Is self employed.  Education AD.  Children one.    Caffeine oz at lunch.     Immunization History  Administered Date(s)  Administered   PFIZER(Purple Top)SARS-COV-2 Vaccination 12/01/2019, 12/26/2019, 08/25/2020   Pfizer Covid-19 Vaccine Bivalent Booster 66yrs & up 06/28/2021     Objective: Vital Signs: There were no vitals taken for this visit.   Physical Exam   Musculoskeletal Exam: ***  CDAI Exam: CDAI Score: -- Patient Global: --; Provider Global: -- Swollen: --; Tender: -- Joint Exam 10/02/2021   No joint exam has been documented for this visit   There is currently no information documented on the homunculus. Go to the Rheumatology activity and complete the homunculus joint exam.  Investigation: No additional findings.  Imaging: No results found.  Recent Labs: No results found for: WBC, HGB, PLT, NA, K, CL, CO2, GLUCOSE, BUN, CREATININE, BILITOT, ALKPHOS, AST, ALT, PROT, ALBUMIN, CALCIUM, GFRAA, QFTBGOLD, QFTBGOLDPLUS  Speciality Comments: No specialty comments available.  Procedures:  No procedures performed Allergies: Patient has no known allergies.   Assessment / Plan:     Visit Diagnoses: No diagnosis found.  ***  Orders: No orders of the defined types were placed in this encounter.  No orders of the defined types were placed in this encounter.    Follow-Up Instructions: No follow-ups on file.   Collier Salina, MD  Note - This record has been created using [employer].  Chart creation errors have been sought, but may not always  have been located. Such creation errors do not reflect on  the standard of medical care.

## 2021-10-02 ENCOUNTER — Ambulatory Visit: Payer: BC Managed Care – PPO | Admitting: Internal Medicine

## 2021-10-23 DIAGNOSIS — M5136 Other intervertebral disc degeneration, lumbar region: Secondary | ICD-10-CM | POA: Diagnosis not present

## 2021-10-24 DIAGNOSIS — R972 Elevated prostate specific antigen [PSA]: Secondary | ICD-10-CM | POA: Diagnosis not present

## 2021-10-29 DIAGNOSIS — M5416 Radiculopathy, lumbar region: Secondary | ICD-10-CM | POA: Diagnosis not present

## 2021-10-29 DIAGNOSIS — M545 Low back pain, unspecified: Secondary | ICD-10-CM | POA: Diagnosis not present

## 2021-11-06 DIAGNOSIS — M5416 Radiculopathy, lumbar region: Secondary | ICD-10-CM | POA: Diagnosis not present

## 2021-11-08 DIAGNOSIS — M5416 Radiculopathy, lumbar region: Secondary | ICD-10-CM | POA: Diagnosis not present

## 2021-11-12 DIAGNOSIS — M5416 Radiculopathy, lumbar region: Secondary | ICD-10-CM | POA: Diagnosis not present

## 2021-11-19 DIAGNOSIS — M5416 Radiculopathy, lumbar region: Secondary | ICD-10-CM | POA: Diagnosis not present

## 2021-11-25 DIAGNOSIS — N3941 Urge incontinence: Secondary | ICD-10-CM | POA: Diagnosis not present

## 2021-11-25 DIAGNOSIS — R3912 Poor urinary stream: Secondary | ICD-10-CM | POA: Diagnosis not present

## 2021-11-26 DIAGNOSIS — M5416 Radiculopathy, lumbar region: Secondary | ICD-10-CM | POA: Diagnosis not present

## 2021-12-03 DIAGNOSIS — M5416 Radiculopathy, lumbar region: Secondary | ICD-10-CM | POA: Diagnosis not present

## 2021-12-04 DIAGNOSIS — R030 Elevated blood-pressure reading, without diagnosis of hypertension: Secondary | ICD-10-CM | POA: Diagnosis not present

## 2021-12-04 DIAGNOSIS — M5416 Radiculopathy, lumbar region: Secondary | ICD-10-CM | POA: Diagnosis not present

## 2021-12-30 ENCOUNTER — Other Ambulatory Visit: Payer: Self-pay | Admitting: Neurosurgery

## 2022-01-01 NOTE — Progress Notes (Signed)
Surgical Instructions ? ? ? Your procedure is scheduled on Tuesday, April 18th. ? Report to [facility] Main Entrance "A" at 9:00 A.M., then check in with the Admitting office. ? Call this number if you have problems the morning of surgery: ? [phone] ? ? If you have any questions prior to your surgery date call [phone]: Open Monday-Friday 8am-4pm ? ? ? Remember: ? Do not eat after midnight the night before your surgery ? ?You may drink clear liquids until 8:00 AM the morning of your surgery.   ?Clear liquids allowed are: Water, Non-Citrus Juices (without pulp), Carbonated Beverages, Clear Tea, Black Coffee ONLY (NO MILK, CREAM OR POWDERED CREAMER of any kind), and Gatorade ?  ? Take these medicines the morning of surgery with A SIP OF WATER:  ? Mirabegron ER (Myrbetriq) ? ? ?As of today, STOP taking any Aspirin (unless otherwise instructed by your surgeon) Aleve, Naproxen, Ibuprofen, Motrin, Advil, Goody's, BC's, all herbal medications, fish oil, and all vitamins. ? ?         DAY OF SURGERY: ?Do not wear jewelry  ?Do not wear lotions, powders, colognes, or deodorant. ?Men may shave face and neck. ?Do not bring valuables to the hospital. ? ?[facility] is not responsible for any belongings or valuables. .  ? ?Do NOT Smoke (Tobacco/Vaping)  24 hours prior to your procedure ? ?If you use a CPAP at night, you may bring your mask for your overnight stay. ?  ?Contacts, glasses, hearing aids, dentures or partials may not be worn into surgery, please bring cases for these belongings ?  ?For patients admitted to the hospital, discharge time will be determined by your treatment team. ?  ?Patients discharged the day of surgery will not be allowed to drive home, and someone needs to stay with them for 24 hours. ? ? ?SURGICAL WAITING ROOM VISITATION ?Patients having surgery or a procedure in a hospital may have two support people. ?Children under the age of 25 must have an adult with them who is not the patient. ?They  may stay in the waiting area during the procedure and may switch out with other visitors. If the patient needs to stay at the hospital during part of their recovery, the visitor guidelines for inpatient rooms apply. ? ?Please refer to the [facility] website for the visitor guidelines for Inpatients (after your surgery is over and you are in a regular room).  ? ? ?Special instructions:   ? ?Oral Hygiene is also important to reduce your risk of infection.  Remember - BRUSH YOUR TEETH THE MORNING OF SURGERY WITH YOUR REGULAR TOOTHPASTE ? ? ?[facility]- Preparing For Surgery ? ?Before surgery, you can play an important role. Because skin is not sterile, your skin needs to be as free of germs as possible. You can reduce the number of germs on your skin by washing with CHG (chlorahexidine gluconate) Soap before surgery.  CHG is an antiseptic cleaner which kills germs and bonds with the skin to continue killing germs even after washing.   ? ? ?Please do not use if you have an allergy to CHG or antibacterial soaps. If your skin becomes reddened/irritated stop using the CHG.  ?Do not shave (including legs and underarms) for at least 48 hours prior to first CHG shower. It is OK to shave your face. ? ?Please follow these instructions carefully. ?  ? ? Shower the NIGHT BEFORE SURGERY and the MORNING OF SURGERY with CHG Soap.  ? If you chose  to wash your hair, wash your hair first as usual with your normal shampoo. After you shampoo, rinse your hair and body thoroughly to remove the shampoo.  Then [employer] and genitals (private parts) with your normal soap and rinse thoroughly to remove soap. ? ?After that Use CHG Soap as you would any other liquid soap. You can apply CHG directly to the skin and wash gently with a scrungie or a clean washcloth.  ? ?Apply the CHG Soap to your body ONLY FROM THE NECK DOWN.  Do not use on open wounds or open sores. Avoid contact with your eyes, ears, mouth and genitals (private parts). Wash Face  and genitals (private parts)  with your normal soap.  ? ?Wash thoroughly, paying special attention to the area where your surgery will be performed. ? ?Thoroughly rinse your body with warm water from the neck down. ? ?DO NOT shower/wash with your normal soap after using and rinsing off the CHG Soap. ? ?Pat yourself dry with a CLEAN TOWEL. ? ?Wear CLEAN PAJAMAS to bed the night before surgery ? ?Place CLEAN SHEETS on your bed the night before your surgery ? ?DO NOT SLEEP WITH PETS. ? ? ?Day of Surgery: ? ?Take a shower with CHG soap. ?Wear Clean/Comfortable clothing the morning of surgery ?Do not apply any deodorants/lotions.   ?Remember to brush your teeth WITH YOUR REGULAR TOOTHPASTE. ? ?  ?Please read over the following fact sheets that you were given.  ? ?

## 2022-01-02 ENCOUNTER — Encounter (HOSPITAL_COMMUNITY): Payer: Self-pay

## 2022-01-02 ENCOUNTER — Encounter (HOSPITAL_COMMUNITY)
Admission: RE | Admit: 2022-01-02 | Discharge: 2022-01-02 | Disposition: A | Discharge status: Home / Self Care | Payer: BC Managed Care – PPO | Source: Ambulatory Visit | Attending: Neurosurgery | Admitting: Neurosurgery

## 2022-01-02 ENCOUNTER — Other Ambulatory Visit: Payer: Self-pay

## 2022-01-02 VITALS — BP 120/80 | HR 69 | Temp 98.3°F | Resp 17 | Ht 69.0 in | Wt 170.8 lb

## 2022-01-02 DIAGNOSIS — E559 Vitamin D deficiency, unspecified: Secondary | ICD-10-CM

## 2022-01-02 DIAGNOSIS — E782 Mixed hyperlipidemia: Secondary | ICD-10-CM | POA: Diagnosis not present

## 2022-01-02 DIAGNOSIS — M5416 Radiculopathy, lumbar region: Secondary | ICD-10-CM | POA: Diagnosis not present

## 2022-01-02 DIAGNOSIS — Z01812 Encounter for preprocedural laboratory examination: Secondary | ICD-10-CM | POA: Diagnosis not present

## 2022-01-02 DIAGNOSIS — Z01818 Encounter for other preprocedural examination: Secondary | ICD-10-CM

## 2022-01-02 LAB — CBC
HCT: 48 % (ref 39.0–52.0)
Hemoglobin: 15.7 g/dL (ref 13.0–17.0)
MCH: 28.4 pg (ref 26.0–34.0)
MCHC: 32.7 g/dL (ref 30.0–36.0)
MCV: 87 fL (ref 80.0–100.0)
Platelets: 254 10*3/uL (ref 150–400)
RBC: 5.52 MIL/uL (ref 4.22–5.81)
RDW: 12.6 % (ref 11.5–15.5)
WBC: 4.6 10*3/uL (ref 4.0–10.5)
nRBC: 0 % (ref 0.0–0.2)

## 2022-01-02 LAB — TYPE AND SCREEN
ABO/RH(D): B POS
Antibody Screen: NEGATIVE

## 2022-01-02 LAB — SURGICAL PCR SCREEN
MRSA, PCR: NEGATIVE
Staphylococcus aureus: NEGATIVE

## 2022-01-02 NOTE — Progress Notes (Signed)
PCP - Dr. Gildardo Cranker ? ?Chest x-ray - Not indicated ?EKG - Not indicated ?Stress Test - 4-5 years ago no f/u needed ?ECHO - Denies ?Cardiac Cath - Denies ? ?Sleep Study - Denies ? ?DM - Denies ? ?Anesthesia review:  No  ? ?Patient denies shortness of breath, fever, cough and chest pain at PAT appointment ? ? ?All instructions explained to the patient, with a verbal understanding of the material. Patient agrees to go over the instructions while at home for a better understanding.  The opportunity to ask questions was provided. ? ? ?

## 2022-01-07 ENCOUNTER — Ambulatory Visit (HOSPITAL_COMMUNITY): Admission: RE | Admit: 2022-01-07 | Payer: BC Managed Care – PPO | Source: Home / Self Care | Admitting: Neurosurgery

## 2022-01-07 ENCOUNTER — Encounter (HOSPITAL_COMMUNITY): Payer: Self-pay | Admitting: Neurosurgery

## 2022-01-07 ENCOUNTER — Other Ambulatory Visit: Payer: Self-pay

## 2022-01-07 ENCOUNTER — Observation Stay (HOSPITAL_COMMUNITY)
Admission: RE | Admit: 2022-01-07 | Discharge: 2022-01-08 | Disposition: A | Discharge status: Home / Self Care | Payer: BC Managed Care – PPO | Attending: Neurosurgery | Admitting: Neurosurgery

## 2022-01-07 ENCOUNTER — Ambulatory Visit (HOSPITAL_COMMUNITY): Payer: BC Managed Care – PPO

## 2022-01-07 ENCOUNTER — Encounter (HOSPITAL_COMMUNITY): Admission: RE | Payer: Self-pay | Source: Home / Self Care

## 2022-01-07 ENCOUNTER — Ambulatory Visit (HOSPITAL_COMMUNITY): Payer: BC Managed Care – PPO | Admitting: Anesthesiology

## 2022-01-07 ENCOUNTER — Encounter (HOSPITAL_COMMUNITY)
Admission: RE | Disposition: A | Discharge status: Home / Self Care | Payer: Self-pay | Source: Home / Self Care | Attending: Neurosurgery

## 2022-01-07 DIAGNOSIS — Z981 Arthrodesis status: Secondary | ICD-10-CM | POA: Insufficient documentation

## 2022-01-07 DIAGNOSIS — M5416 Radiculopathy, lumbar region: Secondary | ICD-10-CM | POA: Diagnosis present

## 2022-01-07 DIAGNOSIS — M4326 Fusion of spine, lumbar region: Secondary | ICD-10-CM | POA: Diagnosis not present

## 2022-01-07 DIAGNOSIS — M5116 Intervertebral disc disorders with radiculopathy, lumbar region: Secondary | ICD-10-CM | POA: Diagnosis not present

## 2022-01-07 DIAGNOSIS — M5126 Other intervertebral disc displacement, lumbar region: Secondary | ICD-10-CM | POA: Insufficient documentation

## 2022-01-07 DIAGNOSIS — M48061 Spinal stenosis, lumbar region without neurogenic claudication: Secondary | ICD-10-CM | POA: Diagnosis not present

## 2022-01-07 DIAGNOSIS — Z79899 Other long term (current) drug therapy: Secondary | ICD-10-CM | POA: Insufficient documentation

## 2022-01-07 LAB — ABO/RH: ABO/RH(D): B POS

## 2022-01-07 SURGERY — POSTERIOR LUMBAR FUSION 1 LEVEL
Anesthesia: General | Site: Back

## 2022-01-07 SURGERY — POSTERIOR LUMBAR FUSION 1 LEVEL
Anesthesia: General | Site: Spine Lumbar

## 2022-01-07 MED ORDER — POLYETHYLENE GLYCOL 3350 17 G PO PACK: 17.0000 g | PACK | Freq: Every day | ORAL | Status: DC | PRN

## 2022-01-07 MED ORDER — LACTATED RINGERS IV SOLN: INTRAVENOUS | Status: DC

## 2022-01-07 MED ORDER — HYDROCODONE-ACETAMINOPHEN 10-325 MG PO TABS
2.0000 | ORAL_TABLET | ORAL | Status: DC | PRN
Start: 2022-01-07 — End: 2022-01-08
  Administered 2022-01-07 – 2022-01-08 (×4): 2 via ORAL
  Filled 2022-01-07 (×4): qty 2

## 2022-01-07 MED ORDER — MIDAZOLAM HCL 5 MG/5ML IJ SOLN
INTRAMUSCULAR | Status: DC | PRN
  Administered 2022-01-07: 2 mg via INTRAVENOUS

## 2022-01-07 MED ORDER — ACETAMINOPHEN 325 MG PO TABS
650.0000 mg | ORAL_TABLET | ORAL | Status: DC | PRN
  Administered 2022-01-07: 650 mg via ORAL
  Filled 2022-01-07: qty 2

## 2022-01-07 MED ORDER — BUPIVACAINE HCL (PF) 0.25 % IJ SOLN
INTRAMUSCULAR | Status: AC
  Filled 2022-01-07: qty 30

## 2022-01-07 MED ORDER — EPHEDRINE 5 MG/ML INJ
INTRAVENOUS | Status: AC
  Filled 2022-01-07: qty 5

## 2022-01-07 MED ORDER — MIDAZOLAM HCL 2 MG/2ML IJ SOLN
INTRAMUSCULAR | Status: AC
  Filled 2022-01-07: qty 2

## 2022-01-07 MED ORDER — BISACODYL 10 MG RE SUPP: 10.0000 mg | Freq: Every day | RECTAL | Status: DC | PRN

## 2022-01-07 MED ORDER — MELATONIN 5 MG PO TABS
5.0000 mg | ORAL_TABLET | Freq: Every day | ORAL | Status: DC
  Administered 2022-01-07: 5 mg via ORAL
  Filled 2022-01-07: qty 1

## 2022-01-07 MED ORDER — ACETAMINOPHEN 10 MG/ML IV SOLN
INTRAVENOUS | Status: DC | PRN
  Administered 2022-01-07: 1000 mg via INTRAVENOUS

## 2022-01-07 MED ORDER — MENTHOL 3 MG MT LOZG: 1.0000 | LOZENGE | OROMUCOSAL | Status: DC | PRN

## 2022-01-07 MED ORDER — THROMBIN 20000 UNITS EX SOLR
CUTANEOUS | Status: AC
  Filled 2022-01-07: qty 20000

## 2022-01-07 MED ORDER — LIDOCAINE 2% (20 MG/ML) 5 ML SYRINGE
INTRAMUSCULAR | Status: AC
  Filled 2022-01-07: qty 10

## 2022-01-07 MED ORDER — SUGAMMADEX SODIUM 200 MG/2ML IV SOLN
INTRAVENOUS | Status: DC | PRN
Start: 2022-01-07 — End: 2022-01-07
  Administered 2022-01-07: 200 mg via INTRAVENOUS

## 2022-01-07 MED ORDER — MIRABEGRON ER 25 MG PO TB24
25.0000 mg | ORAL_TABLET | Freq: Every day | ORAL | Status: DC
  Administered 2022-01-08: 25 mg via ORAL
  Filled 2022-01-07: qty 1

## 2022-01-07 MED ORDER — DIPHENHYDRAMINE HCL 25 MG PO CAPS: 25.0000 mg | ORAL_CAPSULE | Freq: Every day | ORAL | Status: DC | PRN

## 2022-01-07 MED ORDER — PHENOL 1.4 % MT LIQD: 1.0000 | OROMUCOSAL | Status: DC | PRN

## 2022-01-07 MED ORDER — FENTANYL CITRATE (PF) 100 MCG/2ML IJ SOLN
25.0000 ug | INTRAMUSCULAR | Status: DC | PRN
  Administered 2022-01-07 (×3): 50 ug via INTRAVENOUS

## 2022-01-07 MED ORDER — THROMBIN 20000 UNITS EX SOLR: CUTANEOUS | Status: DC | PRN

## 2022-01-07 MED ORDER — TRAZODONE HCL 50 MG PO TABS
100.0000 mg | ORAL_TABLET | Freq: Every day | ORAL | Status: DC
  Administered 2022-01-07: 100 mg via ORAL
  Filled 2022-01-07: qty 2

## 2022-01-07 MED ORDER — 0.9 % SODIUM CHLORIDE (POUR BTL) OPTIME
TOPICAL | Status: DC | PRN
Start: 2022-01-07 — End: 2022-01-07
  Administered 2022-01-07: 1000 mL

## 2022-01-07 MED ORDER — DEXAMETHASONE SODIUM PHOSPHATE 10 MG/ML IJ SOLN
INTRAMUSCULAR | Status: DC | PRN
  Administered 2022-01-07: 10 mg via INTRAVENOUS

## 2022-01-07 MED ORDER — SODIUM CHLORIDE 0.9% FLUSH: 3.0000 mL | INTRAVENOUS | Status: DC | PRN

## 2022-01-07 MED ORDER — OXYCODONE HCL 5 MG PO TABS: 5.0000 mg | ORAL_TABLET | Freq: Once | ORAL | Status: DC | PRN

## 2022-01-07 MED ORDER — FENTANYL CITRATE (PF) 250 MCG/5ML IJ SOLN
INTRAMUSCULAR | Status: AC
  Filled 2022-01-07: qty 5

## 2022-01-07 MED ORDER — SODIUM CHLORIDE 0.9 % IV SOLN
250.0000 mL | INTRAVENOUS | Status: DC
  Administered 2022-01-07: 250 mL via INTRAVENOUS

## 2022-01-07 MED ORDER — FENTANYL CITRATE (PF) 250 MCG/5ML IJ SOLN
INTRAMUSCULAR | Status: DC | PRN
Start: 2022-01-07 — End: 2022-01-07
  Administered 2022-01-07: 50 ug via INTRAVENOUS
  Administered 2022-01-07 (×2): 100 ug via INTRAVENOUS

## 2022-01-07 MED ORDER — CHLORHEXIDINE GLUCONATE 0.12 % MT SOLN
15.0000 mL | Freq: Once | OROMUCOSAL | Status: AC
  Administered 2022-01-07: 15 mL via OROMUCOSAL
  Filled 2022-01-07: qty 15

## 2022-01-07 MED ORDER — ONDANSETRON HCL 4 MG/2ML IJ SOLN: 4.0000 mg | Freq: Four times a day (QID) | INTRAMUSCULAR | Status: DC | PRN

## 2022-01-07 MED ORDER — CEFAZOLIN SODIUM-DEXTROSE 2-4 GM/100ML-% IV SOLN
2.0000 g | INTRAVENOUS | Status: AC
  Administered 2022-01-07: 2 g via INTRAVENOUS
  Filled 2022-01-07: qty 100

## 2022-01-07 MED ORDER — ROCURONIUM BROMIDE 10 MG/ML (PF) SYRINGE
PREFILLED_SYRINGE | INTRAVENOUS | Status: DC | PRN
Start: 2022-01-07 — End: 2022-01-07
  Administered 2022-01-07: 70 mg via INTRAVENOUS
  Administered 2022-01-07: 30 mg via INTRAVENOUS

## 2022-01-07 MED ORDER — KETAMINE HCL 50 MG/ML IJ SOLN
INTRAMUSCULAR | Status: DC | PRN
  Administered 2022-01-07: 20 mg via INTRAMUSCULAR
  Administered 2022-01-07: 10 mg via INTRAMUSCULAR

## 2022-01-07 MED ORDER — ONDANSETRON HCL 4 MG/2ML IJ SOLN
INTRAMUSCULAR | Status: DC | PRN
  Administered 2022-01-07: 4 mg via INTRAVENOUS

## 2022-01-07 MED ORDER — FLEET ENEMA 7-19 GM/118ML RE ENEM: 1.0000 | ENEMA | Freq: Once | RECTAL | Status: DC | PRN

## 2022-01-07 MED ORDER — PHENYLEPHRINE 80 MCG/ML (10ML) SYRINGE FOR IV PUSH (FOR BLOOD PRESSURE SUPPORT)
PREFILLED_SYRINGE | INTRAVENOUS | Status: DC | PRN
Start: 2022-01-07 — End: 2022-01-07
  Administered 2022-01-07 (×3): 80 ug via INTRAVENOUS

## 2022-01-07 MED ORDER — ROCURONIUM BROMIDE 10 MG/ML (PF) SYRINGE
PREFILLED_SYRINGE | INTRAVENOUS | Status: AC
  Filled 2022-01-07: qty 30

## 2022-01-07 MED ORDER — FENTANYL CITRATE (PF) 100 MCG/2ML IJ SOLN
INTRAMUSCULAR | Status: AC
Start: 2022-01-07 — End: 2022-01-08
  Filled 2022-01-07: qty 4

## 2022-01-07 MED ORDER — ONDANSETRON HCL 4 MG PO TABS: 4.0000 mg | ORAL_TABLET | Freq: Four times a day (QID) | ORAL | Status: DC | PRN

## 2022-01-07 MED ORDER — KETAMINE HCL 50 MG/5ML IJ SOSY
PREFILLED_SYRINGE | INTRAMUSCULAR | Status: AC
  Filled 2022-01-07: qty 5

## 2022-01-07 MED ORDER — PROPOFOL 10 MG/ML IV BOLUS
INTRAVENOUS | Status: AC
  Filled 2022-01-07: qty 20

## 2022-01-07 MED ORDER — CHLORHEXIDINE GLUCONATE CLOTH 2 % EX PADS: 6.0000 | MEDICATED_PAD | Freq: Once | CUTANEOUS | Status: DC

## 2022-01-07 MED ORDER — OXYCODONE HCL 5 MG PO TABS
10.0000 mg | ORAL_TABLET | ORAL | Status: DC | PRN
  Administered 2022-01-07: 10 mg via ORAL
  Filled 2022-01-07: qty 2

## 2022-01-07 MED ORDER — LIDOCAINE 2% (20 MG/ML) 5 ML SYRINGE
INTRAMUSCULAR | Status: DC | PRN
  Administered 2022-01-07: 60 mg via INTRAVENOUS

## 2022-01-07 MED ORDER — PHENYLEPHRINE 80 MCG/ML (10ML) SYRINGE FOR IV PUSH (FOR BLOOD PRESSURE SUPPORT)
PREFILLED_SYRINGE | INTRAVENOUS | Status: AC
  Filled 2022-01-07: qty 30

## 2022-01-07 MED ORDER — VANCOMYCIN HCL 1000 MG IV SOLR
INTRAVENOUS | Status: AC
  Filled 2022-01-07: qty 20

## 2022-01-07 MED ORDER — METHOCARBAMOL 500 MG PO TABS
500.0000 mg | ORAL_TABLET | Freq: Four times a day (QID) | ORAL | Status: DC | PRN
  Administered 2022-01-07 – 2022-01-08 (×3): 500 mg via ORAL
  Filled 2022-01-07 (×2): qty 1

## 2022-01-07 MED ORDER — METHOCARBAMOL 500 MG PO TABS
ORAL_TABLET | ORAL | Status: AC
Start: 2022-01-07 — End: 2022-01-08
  Filled 2022-01-07: qty 1

## 2022-01-07 MED ORDER — VANCOMYCIN HCL 1000 MG IV SOLR
INTRAVENOUS | Status: DC | PRN
Start: 2022-01-07 — End: 2022-01-07
  Administered 2022-01-07: 1000 mg

## 2022-01-07 MED ORDER — HYDROMORPHONE HCL 1 MG/ML IJ SOLN
1.0000 mg | INTRAMUSCULAR | Status: DC | PRN
  Administered 2022-01-07 – 2022-01-08 (×2): 1 mg via INTRAVENOUS
  Filled 2022-01-07 (×2): qty 1

## 2022-01-07 MED ORDER — ORAL CARE MOUTH RINSE: 15.0000 mL | Freq: Once | OROMUCOSAL | Status: AC

## 2022-01-07 MED ORDER — METHOCARBAMOL 1000 MG/10ML IJ SOLN
500.0000 mg | Freq: Four times a day (QID) | INTRAVENOUS | Status: DC | PRN
  Filled 2022-01-07: qty 5

## 2022-01-07 MED ORDER — SODIUM CHLORIDE 0.9% FLUSH
3.0000 mL | Freq: Two times a day (BID) | INTRAVENOUS | Status: DC
  Administered 2022-01-07 – 2022-01-08 (×2): 3 mL via INTRAVENOUS

## 2022-01-07 MED ORDER — HYDROCODONE-ACETAMINOPHEN 10-325 MG PO TABS: 1.0000 | ORAL_TABLET | ORAL | Status: DC | PRN

## 2022-01-07 MED ORDER — ACETAMINOPHEN 650 MG RE SUPP: 650.0000 mg | RECTAL | Status: DC | PRN

## 2022-01-07 MED ORDER — OXYCODONE HCL 5 MG/5ML PO SOLN: 5.0000 mg | Freq: Once | ORAL | Status: DC | PRN

## 2022-01-07 MED ORDER — PROPOFOL 10 MG/ML IV BOLUS
INTRAVENOUS | Status: DC | PRN
  Administered 2022-01-07: 200 mg via INTRAVENOUS

## 2022-01-07 MED ORDER — BUPIVACAINE HCL (PF) 0.25 % IJ SOLN
INTRAMUSCULAR | Status: DC | PRN
  Administered 2022-01-07: 20 mL

## 2022-01-07 MED ORDER — CEFAZOLIN SODIUM-DEXTROSE 1-4 GM/50ML-% IV SOLN
1.0000 g | Freq: Three times a day (TID) | INTRAVENOUS | Status: AC
  Administered 2022-01-07 (×2): 1 g via INTRAVENOUS
  Filled 2022-01-07 (×2): qty 50

## 2022-01-07 MED ORDER — ACETAMINOPHEN 10 MG/ML IV SOLN
INTRAVENOUS | Status: AC
  Filled 2022-01-07: qty 100

## 2022-01-07 SURGICAL SUPPLY — 65 items
BAG COUNTER SPONGE SURGICOUNT (BAG) ×2 IMPLANT
BAG DECANTER FOR FLEXI CONT (MISCELLANEOUS) ×2 IMPLANT
BENZOIN TINCTURE PRP APPL 2/3 (GAUZE/BANDAGES/DRESSINGS) ×2 IMPLANT
BLADE CLIPPER SURG (BLADE) IMPLANT
BONE GRAFTON DBF INJECT 6CC (Bone Implant) ×1 IMPLANT
BUR CUTTER 7.0 ROUND (BURR) ×1 IMPLANT
BUR MATCHSTICK NEURO 3.0 LAGG (BURR) ×2 IMPLANT
CAGE EXP CATALYFT 9 (Plate) ×2 IMPLANT
CANISTER SUCT 3000ML PPV (MISCELLANEOUS) ×2 IMPLANT
CAP LCK SPNE (Orthopedic Implant) ×4 IMPLANT
CAP LOCK SPINE RADIUS (Orthopedic Implant) IMPLANT
CAP LOCKING (Orthopedic Implant) ×8 IMPLANT
CARTRIDGE OIL MAESTRO DRILL (MISCELLANEOUS) ×1 IMPLANT
CLSR STERI-STRIP ANTIMIC 1/2X4 (GAUZE/BANDAGES/DRESSINGS) ×1 IMPLANT
CNTNR URN SCR LID CUP LEK RST (MISCELLANEOUS) ×1 IMPLANT
CONT SPEC 4OZ STRL OR WHT (MISCELLANEOUS) ×2
COVER BACK TABLE 60X90IN (DRAPES) ×2 IMPLANT
DECANTER SPIKE VIAL GLASS SM (MISCELLANEOUS) ×1 IMPLANT
DERMABOND ADVANCED (GAUZE/BANDAGES/DRESSINGS) ×1
DERMABOND ADVANCED .7 DNX12 (GAUZE/BANDAGES/DRESSINGS) ×1 IMPLANT
DIFFUSER DRILL AIR PNEUMATIC (MISCELLANEOUS) ×2 IMPLANT
DRAPE C-ARM 42X72 X-RAY (DRAPES) ×4 IMPLANT
DRAPE HALF SHEET 40X57 (DRAPES) IMPLANT
DRAPE LAPAROTOMY 100X72X124 (DRAPES) ×2 IMPLANT
DRAPE SURG 17X23 STRL (DRAPES) ×5 IMPLANT
DRSG OPSITE POSTOP 4X6 (GAUZE/BANDAGES/DRESSINGS) ×2 IMPLANT
DURAPREP 26ML APPLICATOR (WOUND CARE) ×2 IMPLANT
ELECT BLADE 4.0 EZ CLEAN MEGAD (MISCELLANEOUS) ×2
ELECT REM PT RETURN 9FT ADLT (ELECTROSURGICAL) ×2
ELECTRODE BLDE 4.0 EZ CLN MEGD (MISCELLANEOUS) IMPLANT
ELECTRODE REM PT RTRN 9FT ADLT (ELECTROSURGICAL) ×1 IMPLANT
EVACUATOR 1/8 PVC DRAIN (DRAIN) IMPLANT
GAUZE 4X4 16PLY ~~LOC~~+RFID DBL (SPONGE) IMPLANT
GAUZE SPONGE 4X4 12PLY STRL (GAUZE/BANDAGES/DRESSINGS) IMPLANT
GLOVE BIO SURGEON STRL SZ 6.5 (GLOVE) ×2 IMPLANT
GLOVE BIOGEL PI IND STRL 6.5 (GLOVE) ×1 IMPLANT
GLOVE BIOGEL PI INDICATOR 6.5 (GLOVE) ×1
GLOVE ECLIPSE 9.0 STRL (GLOVE) ×4 IMPLANT
GLOVE EXAM NITRILE XL STR (GLOVE) IMPLANT
GLOVE SURG SS PI 6.5 STRL IVOR (GLOVE) ×4 IMPLANT
GOWN STRL REUS W/ TWL LRG LVL3 (GOWN DISPOSABLE) IMPLANT
GOWN STRL REUS W/ TWL XL LVL3 (GOWN DISPOSABLE) ×2 IMPLANT
GOWN STRL REUS W/TWL 2XL LVL3 (GOWN DISPOSABLE) IMPLANT
GOWN STRL REUS W/TWL LRG LVL3 (GOWN DISPOSABLE) ×6
GOWN STRL REUS W/TWL XL LVL3 (GOWN DISPOSABLE) ×4
KIT BASIN OR (CUSTOM PROCEDURE TRAY) ×2 IMPLANT
KIT TURNOVER KIT B (KITS) ×2 IMPLANT
MILL MEDIUM DISP (BLADE) ×2 IMPLANT
NEEDLE HYPO 22GX1.5 SAFETY (NEEDLE) ×2 IMPLANT
NS IRRIG 1000ML POUR BTL (IV SOLUTION) ×2 IMPLANT
OIL CARTRIDGE MAESTRO DRILL (MISCELLANEOUS) ×2
PACK LAMINECTOMY NEURO (CUSTOM PROCEDURE TRAY) ×2 IMPLANT
PENCIL BUTTON HOLSTER BLD 10FT (ELECTRODE) ×1 IMPLANT
ROD RADIUS 35MM (Rod) ×2 IMPLANT
SCREW 5.75X45MM (Screw) ×4 IMPLANT
SPONGE SURGIFOAM ABS GEL 100 (HEMOSTASIS) ×2 IMPLANT
STRIP CLOSURE SKIN 1/2X4 (GAUZE/BANDAGES/DRESSINGS) ×4 IMPLANT
SUT VIC AB 0 CT1 18XCR BRD8 (SUTURE) ×2 IMPLANT
SUT VIC AB 0 CT1 8-18 (SUTURE) ×4
SUT VIC AB 2-0 CT1 18 (SUTURE) ×2 IMPLANT
SUT VIC AB 3-0 SH 8-18 (SUTURE) ×4 IMPLANT
TOWEL GREEN STERILE (TOWEL DISPOSABLE) ×2 IMPLANT
TOWEL GREEN STERILE FF (TOWEL DISPOSABLE) ×2 IMPLANT
TRAY FOLEY MTR SLVR 16FR STAT (SET/KITS/TRAYS/PACK) ×2 IMPLANT
WATER STERILE IRR 1000ML POUR (IV SOLUTION) ×2 IMPLANT

## 2022-01-07 NOTE — Op Note (Signed)
Date of procedure: 01/07/2022 ? ?Date of dictation: Same ? ?Service: Neurosurgery ? 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 ? ?Postoperative diagnosis: Same ? ?Procedure Name: Bilateral L4-5 decompressive laminotomies and foraminotomies, more than be required for simple interbody fusion alone. ? ?L4-5 posterior lumbar interbody fusion utilizing interbody cage, local harvested autograft, and morselized allograft ? ?L4-5 posterior lateral arthrodesis utilizing nonsegmental pedicle screw fixation and local autograft. ? ?Surgeon:Carroll Ranney A.Aala Ransom, M.D. ? ?Asst. Surgeon: Conchita Paris, MD ? ?Anesthesia: General ? ?Indication: 56 year old male remotely status post L5-S1 decompression and fusion approximately 25 years ago presents with severe back and bilateral lower extremity symptoms failing conservative management.  Work-up demonstrates evidence of a large central disc protrusion with associated disc base collapse and foraminal stenosis as well as central stenosis.  Patient has failed conservative management.  He presents now for bilateral decompression and fusion surgery. ? ?Operative note: After induction of anesthesia, patient position prone onto Wilson frame and appropriate padded.  Lumbar region prepped and draped sterilely.  Incision made overlying L4-5.  Dissection performed bilaterally.  Retractor placed.  Fluoroscopy used.  Levels confirmed.  Decompressive laminotomies and foraminotomies performed using Leksell rongeurs and Kerrison rongeurs to remove the inferior two thirds of the lamina of L4 the entire inferior facet and pars interarticularis of L4 and the superior facet of L5 as well as the superior aspect of the lamina of L5.  Ligament flavum elevated and resected.  Bilateral discectomies performed including all elements of the central disc protrusion.  The space then prepared for interbody fusion.  With distractors  placed patient's right side.  The space was further cleaned and prepared on the left side.  A 9 mm Medtronic expandable titanium cage was then impacted in the place and expanded.  Distractor removed patient's right side.  Disc base prepared on the right side.  Morselized autograft packed in the interspace.  Second cage was then impacted in the place and expanded.  Pedicles of L4 and L5 identified using surface landmarks and intraoperative fluoroscopy and superficial bone around the pedicle was then removed using high-speed drill.  Each pedicle was then probed using a pedicle awl.  Each pedicle all track was probed and found to be solidly within the bone.  Each pedicle tract was then tapped.  5.75 mm radius brand screws from [employer] placed bilaterally at L4 and L5.  Final images reveal good position of the cages and the hardware at the proper operative level with normal alignment of the spine.  Wound is then irrigated.  Transverse processes were decorticated.  Morselized autograft was packed posterior laterally.  Short segment titanium rod placed over the screw heads at L4 and L5.  Locking caps placed over the screws and locking caps and engaged with construct under compression.  Each cage was packed with demineralized bone matrix to further fill of the interspace.  Gelfoam was placed over the laminotomy defects.  Vancomycin powder placed in deep wound space.  Wounds then closed in layers with Vicryl sutures.  Steri-Strips and sterile dressing were applied.  No apparent complications.  Patient tolerated the procedure well and he returned to the recovery room postop. ? ?

## 2022-01-07 NOTE — Plan of Care (Signed)

## 2022-01-07 NOTE — Anesthesia Preprocedure Evaluation (Signed)
Anesthesia Evaluation  ?Patient identified by MRN, date of birth, ID band ?Patient awake ? ? ? ?Reviewed: ?Allergy & Precautions, H&P , NPO status , Patient's Chart, lab work & pertinent test results ? ?Airway ?Mallampati: II ? ? ?Neck ROM: full ? ? ? Dental ?  ?Pulmonary ?neg pulmonary ROS,  ?  ?breath sounds clear to auscultation ? ? ? ? ? ? Cardiovascular ? ?Rhythm:regular Rate:Normal ? ?hypercholesterolemia ?  ?Neuro/Psych ?  ? GI/Hepatic ?GERD  ,  ?Endo/Other  ? ? Renal/GU ?  ? ?  ?Musculoskeletal ? ?(+) Arthritis ,  ? Abdominal ?  ?Peds ? Hematology ?  ?Anesthesia Other Findings ? ? Reproductive/Obstetrics ? ?  ? ? ? ? ? ? ? ? ? ? ? ? ? ?  ?  ? ? ? ? ? ? ? ? ?Anesthesia Physical ?Anesthesia Plan ? ?ASA: 2 ? ?Anesthesia Plan: General  ? ?Post-op Pain Management:   ? ?Induction: Intravenous ? ?PONV Risk Score and Plan: 2 and Ondansetron, Dexamethasone, Treatment may vary due to age or medical condition and Midazolam ? ?Airway Management Planned: Oral ETT ? ?Additional Equipment:  ? ?Intra-op Plan:  ? ?Post-operative Plan: Extubation in OR ? ?Informed Consent: I have reviewed the patients History and Physical, chart, labs and discussed the procedure including the risks, benefits and alternatives for the proposed anesthesia with the patient or authorized representative who has indicated his/her understanding and acceptance.  ? ? ? ?Dental advisory given ? ?Plan Discussed with: CRNA, Anesthesiologist and Surgeon ? ?Anesthesia Plan Comments:   ? ? ? ? ? ? ?Anesthesia Quick Evaluation ? ?

## 2022-01-07 NOTE — Anesthesia Procedure Notes (Signed)
Procedure Name: Intubation ?Date/Time: 01/07/2022 12:13 PM ?Performed by: Georgia Duff, CRNA ?Pre-anesthesia Checklist: Patient identified, Emergency Drugs available, Suction available and Patient being monitored ?Patient Re-evaluated:Patient Re-evaluated prior to induction ?Oxygen Delivery Method: Circle System Utilized ?Preoxygenation: Pre-oxygenation with 100% oxygen ?Induction Type: IV induction ?Ventilation: Mask ventilation without difficulty ?Laryngoscope Size: Sabra Heck and 2 ?Grade View: Grade I ?Tube type: Oral ?Tube size: 7.5 mm ?Number of attempts: 1 ?Airway Equipment and Method: Stylet and Oral airway ?Placement Confirmation: ETT inserted through vocal cords under direct vision, positive ETCO2 and breath sounds checked- equal and bilateral ?Secured at: 21 cm ?Tube secured with: Tape ?Dental Injury: Teeth and Oropharynx as per pre-operative assessment  ? ? ? ? ?

## 2022-01-07 NOTE — Transfer of Care (Signed)
Immediate Anesthesia Transfer of Care Note ? ?Patient: Ronald Norton. ? ?Procedure(s) Performed: Posterior Lumbar Interbody Fusion - Lumbar four-Lumbar five (Spine Lumbar) ? ?Patient Location: PACU ? ?Anesthesia Type:General ? ?Level of Consciousness: drowsy ? ?Airway & Oxygen Therapy: Patient Spontanous Breathing and Patient connected to nasal cannula oxygen ? ?Post-op Assessment: Report given to RN and Post -op Vital signs reviewed and stable ? ?Post vital signs: Reviewed and stable ? ?Last Vitals:  ?Vitals Value Taken Time  ?BP 124/78 01/07/22 1431  ?Temp    ?Pulse 75 01/07/22 1437  ?Resp 15 01/07/22 1437  ?SpO2 94 % 01/07/22 1437  ?Vitals shown include unvalidated device data. ? ?Last Pain:  ?Vitals:  ? 01/07/22 0925  ?TempSrc:   ?PainSc: 3   ?   ? ?Patients Stated Pain Goal: 4 (01/07/22 0925) ? ?Complications: No notable events documented. ?

## 2022-01-07 NOTE — Brief Op Note (Signed)
01/07/2022 ? ?2:14 PM ? ?PATIENT:  [name].  56 y.o. male ? ?PRE-OPERATIVE DIAGNOSIS:  Stenosis lumbar  ? ?POST-OPERATIVE DIAGNOSIS:  Stenosis lumbar ? ?PROCEDURE:  Procedure(s): ?Posterior Lumbar Interbody Fusion - Lumbar four-Lumbar five (N/A) ? ?SURGEON:  Surgeon(s) and Role: ?   Julio Sicks, MD - Primary ? ?PHYSICIAN ASSISTANT:  ? ?ASSISTANTS: Nundkumar,MD  ? ?ANESTHESIA:   general ? ?EBL:  100 mL  ? ?BLOOD ADMINISTERED:none ? ?DRAINS: none  ? ?LOCAL MEDICATIONS USED:  [place]    ? ?SPECIMEN:  No Specimen ? ?DISPOSITION OF SPECIMEN:  N/A ? ?COUNTS:  YES ? ?TOURNIQUET:  * No tourniquets in log * ? ?DICTATION: .Dragon Dictation ? ?PLAN OF CARE: Admit for overnight observation ? ?PATIENT DISPOSITION:  PACU - hemodynamically stable. ?  ?Delay start of Pharmacological VTE agent (>24hrs) due to surgical blood loss or risk of bleeding: yes ? ?

## 2022-01-07 NOTE — H&P (Signed)
Ronald Norton. is an 56 y.o. male.   ?Chief Complaint: Back pain ?HPI: 56 year old male remotely status post L5-S1 decompression and fusion surgery many years ago who now presents with worsening back and bilateral lower extremity symptoms which of failed conservative manage.  Work-up demonstrates evidence of a broad-based disc herniation with associated stenosis at L4-5.  Patient presents now for L4-5 decompression and fusion in hopes of improving his symptoms. ? ?Past Medical History:  ?Diagnosis Date  ? High cholesterol   ? Psoriatic arthritis (HCC)   ? ? ?Past Surgical History:  ?Procedure Laterality Date  ? BACK SURGERY    ? lumbar disk and fusion  ? ? ?Family History  ?Problem Relation Age of Onset  ? Arthritis Mother   ?     degenerative disc disease  ? Hypertension Father   ? Arthritis/Rheumatoid Father   ? Diabetes Maternal Uncle   ? Diabetes Paternal Uncle   ? ?Social History:  reports that he has never smoked. He has never used smokeless tobacco. He reports current alcohol use. He reports that he does not use drugs. ? ?Allergies: No Known Allergies ? ?Medications Prior to Admission  ?Medication Sig Dispense Refill  ? diphenhydrAMINE (BENADRYL) 25 MG tablet Take 25 mg by mouth daily as needed for allergies.    ? melatonin 5 MG TABS Take 1 capsule by mouth at bedtime.    ? mirabegron ER (MYRBETRIQ) 25 MG TB24 tablet Take 25 mg by mouth daily.    ? sildenafil (REVATIO) 20 MG tablet Take 20 mg by mouth daily as needed (ED).    ? traZODone (DESYREL) 100 MG tablet Take 100 mg by mouth at bedtime.    ? COVID-19 mRNA bivalent vaccine, Pfizer, injection Inject into the muscle. 0.3 mL 0  ? ? ?Results for orders placed or performed during the hospital encounter of 01/07/22 (from the past 48 hour(s))  ?ABO/Rh     Status: None  ? Collection Time: 01/07/22  9:22 AM  ?Result Value Ref Range  ? ABO/RH(D)    ?  B POS ?Performed at [facility], [address], [place], [place] [address] ?  ? ?No results  found. ? ?Pertinent items noted in HPI and remainder of comprehensive ROS otherwise negative. ? ?Blood pressure 129/84, pulse 66, temperature 98.1 ?F (36.7 ?C), temperature source Oral, resp. rate 18, height 5\' 9"  (1.753 m), weight 77.2 kg, SpO2 99 %. ? ?Patient is awake and alert.  He is oriented and appropriate.  Speech is fluent.  Judgment insight are intact.  Cranial nerve function normal bilateral.  Motor examination reveals intact motor strength bilateral.  Sensory examination nonfocal.  Deep tendon flexes normal active.  Gait normal.  Posture normal peer examination head ears eyes nose and throat is unremarked.  Chest and abdomen are benign.  Extremities are free from injury or deformity. 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  Plan bilateral L4-5 decompressive laminotomies and foraminotomies followed by posterior lumbar interbody fusion utilizing interbody cages with posterior pedicle screw fixation.  Risks and benefits of been explained.  Patient wishes to proceed.  \ ? A Lailyn Appelbaum ?01/07/2022, 11:23 AM ? ? ? ? ?

## 2022-01-08 DIAGNOSIS — Z79899 Other long term (current) drug therapy: Secondary | ICD-10-CM | POA: Diagnosis not present

## 2022-01-08 DIAGNOSIS — M5126 Other intervertebral disc displacement, lumbar region: Secondary | ICD-10-CM | POA: Diagnosis not present

## 2022-01-08 DIAGNOSIS — M48061 Spinal stenosis, lumbar region without neurogenic claudication: Secondary | ICD-10-CM | POA: Diagnosis not present

## 2022-01-08 DIAGNOSIS — Z981 Arthrodesis status: Secondary | ICD-10-CM | POA: Diagnosis not present

## 2022-01-08 MED ORDER — METHOCARBAMOL 500 MG PO TABS: 500.0000 mg | ORAL_TABLET | Freq: Four times a day (QID) | ORAL | 1 refills | Status: AC | PRN

## 2022-01-08 MED ORDER — HYDROCODONE-ACETAMINOPHEN 10-325 MG PO TABS: 1.0000 | ORAL_TABLET | ORAL | 0 refills | Status: AC | PRN

## 2022-01-08 NOTE — Evaluation (Signed)
Occupational Therapy Evaluation ?Patient Details ?Name: Ronald Norton. ?MRN: [id number] ?DOB: 1965-10-15 ?Today's Date: 01/08/2022 ? ? ?History of Present Illness Pt is a 56 y/o M s/p L4-5 decompressive laminectomies and foraminectomies. PMH includes high cholesterol and psoriatic arthritis.  ? ?Clinical Impression ?  ?PTA, pt independent with ADLs and functional mobility. Lives at home with spouse who can provide assistance at d/c. Pt supervision-mod I for ADLs, bed mobility and transfers. Educated pt on back precautions, brace wear schedule, and compensatory strategies for grooming, LB dressing, and bed mobility. Pt verbalized understanding and is able to adhere to precautions throughout session. Pt presenting with impairments listed below, however has no acute OT needs at this time. Will s/o. Recommend d/c home with family assistance.  ?   ? ?Recommendations for follow up therapy are one component of a multi-disciplinary discharge planning process, led by the attending physician.  Recommendations may be updated based on patient status, additional functional criteria and insurance authorization.  ? ?Follow Up Recommendations ? No OT follow up  ?  ?Assistance Recommended at Discharge PRN  ?Patient can return home with the following A little help with bathing/dressing/bathroom;Assistance with cooking/housework;Help with stairs or ramp for entrance ? ?  ?Functional Status Assessment ? Patient has had a recent decline in their functional status and demonstrates the ability to make significant improvements in function in a reasonable and predictable amount of time.  ?Equipment Recommendations ? None recommended by OT  ?  ?Recommendations for Other Services   ? ? ?  ?Precautions / Restrictions Precautions ?Precautions: Back ?Precaution Booklet Issued: Yes (comment) ?Precaution Comments: back precautions handout provided, pt can recall 3/3 precautions ?Restrictions ?Weight Bearing Restrictions: No  ? ?  ? ?Mobility  Bed Mobility ?Overal bed mobility: Modified Independent ?  ?  ?  ?  ?  ?  ?General bed mobility comments: using log rolling technique ?  ? ?Transfers ?Overall transfer level: Modified independent ?Equipment used: None ?  ?  ?  ?  ?  ?  ?  ?  ?  ? ?  ?Balance Overall balance assessment: No apparent balance deficits (not formally assessed) ?  ?  ?  ?  ?  ?  ?  ?  ?  ?  ?  ?  ?  ?  ?  ?  ?  ?  ?   ? ?ADL either performed or assessed with clinical judgement  ? ?ADL Overall ADL's : Needs assistance/impaired ?Eating/Feeding: Modified independent;Sitting ?  ?Grooming: Modified independent;Sitting;Standing ?  ?Upper Body Bathing: Modified independent;Sitting;Standing ?  ?Lower Body Bathing: Minimal assistance;Sitting/lateral leans;Sit to/from stand ?  ?Upper Body Dressing : Modified independent;Sitting;Standing ?Upper Body Dressing Details (indicate cue type and reason): to don brace ?Lower Body Dressing: Supervision/safety;Sitting/lateral leans;Sit to/from stand ?Lower Body Dressing Details (indicate cue type and reason): don socks and pants at bedside ?Toilet Transfer: Pharmacist, community;Ambulation ?  ?Toileting- Clothing Manipulation and Hygiene: Modified independent;Sitting/lateral lean ?  ?  ?  ?Functional mobility during ADLs: Modified independent ?   ? ? ? ?Vision   ?Vision Assessment?: No apparent visual deficits  ?   ?Perception   ?  ?Praxis   ?  ? ?Pertinent Vitals/Pain Pain Assessment ?Pain Assessment: No/denies pain  ? ? ? ?Hand Dominance   ?  ?Extremity/Trunk Assessment Upper Extremity Assessment ?Upper Extremity Assessment: Overall WFL for tasks assessed ?  ?Lower Extremity Assessment ?Lower Extremity Assessment: Overall WFL for tasks assessed ?  ?Cervical / Trunk Assessment ?Cervical /  Trunk Assessment: Back Surgery ?  ?Communication Communication ?Communication: No difficulties ?  ?Cognition Arousal/Alertness: Awake/alert ?Behavior During Therapy: [facility] for tasks assessed/performed ?Overall  Cognitive Status: Within Functional Limits for tasks assessed ?  ?  ?  ?  ?  ?  ?  ?  ?  ?  ?  ?  ?  ?  ?  ?  ?  ?  ?  ?General Comments  VSS on RA ? ?  ?Exercises   ?  ?Shoulder Instructions    ? ? ?Home Living Family/patient expects to be discharged to:: Private residence ?Living Arrangements: Spouse/significant other ?Available Help at Discharge: Family;Available 24 hours/day ?Type of Home: House ?Home Access: Stairs to enter ?  ?  ?Home Layout: Two level ?Alternate Level Stairs-Number of Steps: 3-4 ?  ?Bathroom Shower/Tub: Walk-in shower ?  ?Bathroom Toilet: Handicapped height ?  ?  ?Home Equipment: Gilmer Mor - single point ?  ?  ?  ? ?  ?Prior Functioning/Environment Prior Level of Function : Independent/Modified Independent ?  ?  ?  ?  ?  ?  ?Mobility Comments: no AD use ?ADLs Comments: does IADLs ?  ? ?  ?  ?OT Problem List: Decreased range of motion ?  ?   ?OT Treatment/Interventions:    ?  ?OT Goals(Current goals can be found in the care plan section) Acute Rehab OT Goals ?Patient Stated Goal: none stated ?OT Goal Formulation: With patient ?Time For Goal Achievement: 01/21/22 ?Potential to Achieve Goals: Good  ?OT Frequency:   ?  ? ?Co-evaluation   ?  ?  ?  ?  ? ?  ?AM-PAC OT "6 Clicks" Daily Activity     ?Outcome Measure Help from another person eating meals?: None ?Help from another person taking care of personal grooming?: None ?Help from another person toileting, which includes using toliet, bedpan, or urinal?: None ?Help from another person bathing (including washing, rinsing, drying)?: A Little ?Help from another person to put on and taking off regular upper body clothing?: None ?Help from another person to put on and taking off regular lower body clothing?: A Little ?6 Click Score: 22 ?  ?End of Session Equipment Utilized During Treatment: Back brace ?Nurse Communication: Mobility status ? ?Activity Tolerance: Patient tolerated treatment well ?Patient left: in chair;with call bell/phone within reach ? ?OT  Visit Diagnosis: Unsteadiness on feet (R26.81);Muscle weakness (generalized) (M62.81);Other abnormalities of gait and mobility (R26.89)  ?              ?Time: 1025-8527 ?OT Time Calculation (min): 14 min ?Charges:  OT General Charges ?$OT Visit: 1 Visit ?OT Evaluation ?$OT Eval Low Complexity: 1 Low ? ?Alfonzo Beers, OTD, OTR/L ?Acute Rehab ?[phone] ? ?Mayer Masker ?01/08/2022, 9:06 AM ?

## 2022-01-08 NOTE — Evaluation (Signed)
Physical Therapy Evaluation & Discharge ?Patient Details ?Name: Ronald Norton. ?MRN: [id number] ?DOB: June 03, 1966 ?Today's Date: 01/08/2022 ? ?History of Present Illness ? Pt is a 56 y/o M s/p L4-5 decompressive laminectomies and foraminectomies. PMH includes high cholesterol and psoriatic arthritis.  ?Clinical Impression ? Patient admitted following above procedure. Patient functioning at modI level for all mobility and stair negotiation with no AD. Educated patient on back precautions, brace wear, and progressive walking program, patient demonstrated good understanding. Patient states he will be returning to work this week [employer], [employer]). Educated patient on taking it easy to allow for healing. No further skilled PT needs identified acutely. No PT follow up recommended at this time.    ?   ? ?Recommendations for follow up therapy are one component of a multi-disciplinary discharge planning process, led by the attending physician.  Recommendations may be updated based on patient status, additional functional criteria and insurance authorization. ? ?Follow Up Recommendations No PT follow up ? ?  ?Assistance Recommended at Discharge PRN  ?Patient can return home with the following ?   ? ?  ?Equipment Recommendations None recommended by PT  ?Recommendations for Other Services ?    ?  ?Functional Status Assessment Patient has not had a recent decline in their functional status  ? ?  ?Precautions / Restrictions Precautions ?Precautions: Back ?Precaution Booklet Issued: Yes (comment) ?Precaution Comments: back precautions handout provided, pt can recall 3/3 precautions ?Required Braces or Orthoses: Spinal Brace ?Spinal Brace: Lumbar corset ?Restrictions ?Weight Bearing Restrictions: No  ? ?  ? ?Mobility ? Bed Mobility ?  ?  ?  ?  ?  ?  ?  ?General bed mobility comments: in recliner on arrival ?  ? ?Transfers ?Overall transfer level: Modified independent ?Equipment used: None ?  ?  ?  ?  ?  ?  ?   ?  ?  ? ?Ambulation/Gait ?Ambulation/Gait assistance: Modified independent (Device/Increase time) ?Gait Distance (Feet): 200 Feet ?Assistive device: None ?Gait Pattern/deviations: WFL(Within Functional Limits) ?  ?Gait velocity interpretation: >2.62 ft/sec, indicative of community ambulatory ?  ?  ? ?Stairs ?Stairs: Yes ?Stairs assistance: Modified independent (Device/Increase time) ?Stair Management: One rail Left, Alternating pattern, Forwards ?Number of Stairs: 10 ?  ? ?Wheelchair Mobility ?  ? ?Modified Rankin (Stroke Patients Only) ?  ? ?  ? ?Balance Overall balance assessment: No apparent balance deficits (not formally assessed) ?  ?  ?  ?  ?  ?  ?  ?  ?  ?  ?  ?  ?  ?  ?  ?  ?  ?  ?   ? ? ? ?Pertinent Vitals/Pain Pain Assessment ?Pain Assessment: No/denies pain  ? ? ?Home Living Family/patient expects to be discharged to:: Private residence ?Living Arrangements: Spouse/significant other ?Available Help at Discharge: Family;Available 24 hours/day ?Type of Home: House ?Home Access: Stairs to enter ?  ?Entrance Stairs-Number of Steps: 3-4 ?Alternate Level Stairs-Number of Steps: flight ?Home Layout: Two level ?Home Equipment: Kasandra Knudsen - single point ?   ?  ?Prior Function Prior Level of Function : Independent/Modified Independent ?  ?  ?  ?  ?  ?  ?Mobility Comments: no AD use ?ADLs Comments: does IADLs ?  ? ? ?Hand Dominance  ?   ? ?  ?Extremity/Trunk Assessment  ? Upper Extremity Assessment ?Upper Extremity Assessment: Defer to OT evaluation ?  ? ?Lower Extremity Assessment ?Lower Extremity Assessment: Overall WFL for tasks assessed ?  ? ?  Cervical / Trunk Assessment ?Cervical / Trunk Assessment: Back Surgery  ?Communication  ? Communication: No difficulties  ?Cognition Arousal/Alertness: Awake/alert ?Behavior During Therapy: [facility] for tasks assessed/performed ?Overall Cognitive Status: Within Functional Limits for tasks assessed ?  ?  ?  ?  ?  ?  ?  ?  ?  ?  ?  ?  ?  ?  ?  ?  ?  ?  ?  ? ?  ?General Comments General  comments (skin integrity, edema, etc.): VSS on RA ? ?  ?Exercises    ? ?Assessment/Plan  ?  ?PT Assessment Patient does not need any further PT services  ?PT Problem List   ? ?   ?  ?PT Treatment Interventions     ? ?PT Goals (Current goals can be found in the Care Plan section)  ?Acute Rehab PT Goals ?Patient Stated Goal: to get out of here ?PT Goal Formulation: All assessment and education complete, DC therapy ? ?  ?Frequency   ?  ? ? ?Co-evaluation   ?  ?  ?  ?  ? ? ?  ?AM-PAC PT "6 Clicks" Mobility  ?Outcome Measure Help needed turning from your back to your side while in a flat bed without using bedrails?: None ?Help needed moving from lying on your back to sitting on the side of a flat bed without using bedrails?: None ?Help needed moving to and from a bed to a chair (including a wheelchair)?: None ?Help needed standing up from a chair using your arms (e.g., wheelchair or bedside chair)?: None ?Help needed to walk in hospital room?: None ?Help needed climbing 3-5 steps with a railing? : None ?6 Click Score: 24 ? ?  ?End of Session Equipment Utilized During Treatment: Back brace ?Activity Tolerance: Patient tolerated treatment well ?Patient left: in chair;with call bell/phone within reach ?Nurse Communication: Mobility status ?PT Visit Diagnosis: Muscle weakness (generalized) (M62.81) ?  ? ?Time: [id number] ?PT Time Calculation (min) (ACUTE ONLY): 9 min ? ? ?Charges:   PT Evaluation ?$PT Eval Low Complexity: 1 Low ?  ?  ?   ? ? ?Madalaine Portier A. Gilford Rile, PT, DPT ?Acute Rehabilitation Services ?Pager [phone] ?Office [phone] ? ? ?Percy Comp A Alynn Ellithorpe ?01/08/2022, 10:32 AM ? ?

## 2022-01-08 NOTE — Discharge Summary (Signed)
Physician Discharge Summary  ?Patient ID: ?Ronald Norton. ?MRN: [id number] ?DOB/AGE: 1966/06/08 56 y.o. ? ?Admit date: 01/07/2022 ?Discharge date: 01/08/2022 ? ?Admission Diagnoses: ? ?Discharge Diagnoses:  ?Principal Problem: ?  Lumbar radiculopathy ? ? ?Discharged Condition: good ? ?Hospital Course: Patient admitted to the hospital where he wondered uncomplicated [id number] decompression and fusion surgery.  Postoperatively doing very well.  Preoperative back and lower extreme pain much improved.  Standing ambulating and voiding without difficulty.  Ready for discharge home. ? ?Consults:  ? ?Significant Diagnostic Studies:  ? ?Treatments:  ? ?Discharge Exam: ?Blood pressure 106/65, pulse 69, temperature 98.6 ?F (37 ?C), temperature source Oral, resp. rate 18, height 5\' 9"  (1.753 m), weight 77.2 kg, SpO2 98 %. ?Awake and alert.  Oriented and appropriate.  Motor and sensory function intact.  Wound clean and dry.  Chest and abdomen benign. ? ?Disposition: Discharge disposition: 01-Home or Self Care ? ? ? ? ? ? ? ?Allergies as of 01/08/2022   ?No Known Allergies ?  ? ?  ?Medication List  ?  ? ?TAKE these medications   ? ?diphenhydrAMINE 25 MG tablet ?Commonly known as: BENADRYL ?Take 25 mg by mouth daily as needed for allergies. ?  ?HYDROcodone-acetaminophen 10-325 MG tablet ?Commonly known as: NORCO ?Take 1-2 tablets by mouth every 4 (four) hours as needed for severe pain. ?  ?melatonin 5 MG Tabs ?Take 1 capsule by mouth at bedtime. ?  ?methocarbamol 500 MG tablet ?Commonly known as: ROBAXIN ?Take 1 tablet (500 mg total) by mouth every 6 (six) hours as needed for muscle spasms. ?  ?mirabegron ER 25 MG Tb24 tablet ?Commonly known as: MYRBETRIQ ?Take 25 mg by mouth daily. ?  ?Pfizer COVID-19 Vac Bivalent injection ?Generic drug: COVID-19 mRNA bivalent vaccine [employer]) ?Inject into the muscle. ?  ?sildenafil 20 MG tablet ?Commonly known as: REVATIO ?Take 20 mg by mouth daily as needed (ED). ?  ?traZODone 100 MG  tablet ?Commonly known as: DESYREL ?Take 100 mg by mouth at bedtime. ?  ? ?  ? ?  ?  ? ? ?  ?Durable Medical Equipment  ?(From admission, onward)  ?  ? ? ?  ? ?  Start     Ordered  ? 01/07/22 1534  DME Walker rolling  Once       ?Question:  Patient needs a walker to treat with the following condition  Answer:  Lumbar radiculopathy  ? 01/07/22 1533  ? 01/07/22 1534  DME 3 n 1  Once       ? 01/07/22 1533  ? ?  ?  ? ?  ? ? ? ?Signed: ?Cooper Render Radha Coggins ?01/08/2022, 10:16 AM ? ? ?

## 2022-01-08 NOTE — Anesthesia Postprocedure Evaluation (Signed)
Anesthesia Post Note ? ?Patient: Ronald Norton. ? ?Procedure(s) Performed: Posterior Lumbar Interbody Fusion - Lumbar four-Lumbar five (Spine Lumbar) ? ?  ? ?Patient location during evaluation: PACU ?Anesthesia Type: General ?Level of consciousness: awake and alert ?Pain management: pain level controlled ?Vital Signs Assessment: post-procedure vital signs reviewed and stable ?Respiratory status: spontaneous breathing, nonlabored ventilation, respiratory function stable and patient connected to nasal cannula oxygen ?Cardiovascular status: blood pressure returned to baseline and stable ?Postop Assessment: no apparent nausea or vomiting ?Anesthetic complications: no ? ? ?No notable events documented. ? ?Last Vitals:  ?Vitals:  ? 01/08/22 0356 01/08/22 0733  ?BP: (!) 103/55 106/65  ?Pulse: 70 69  ?Resp: 18 18  ?Temp: 37.1 ?C 37 ?C  ?SpO2: 98% 98%  ?  ?Last Pain:  ?Vitals:  ? 01/08/22 0733  ?TempSrc: Oral  ?PainSc:   ? ? ?  ?  ?  ?  ?  ?  ? ?Kathlene Yano S ? ? ? ? ?

## 2022-01-08 NOTE — Discharge Instructions (Addendum)
Wound Care Keep incision covered and dry for three days.   Do not put any creams, lotions, or ointments on incision. Leave steri-strips on back.  They will fall off by themselves. Activity Walk each and every day, increasing distance each day. No lifting greater than 5 lbs.  Avoid excessive neck motion. No driving for 2 weeks; may ride as a passenger locally. If provided with back brace, wear when out of bed.  It is not necessary to wear brace in bed. Diet Resume your normal diet.  Return to Work Will be discussed at you follow up appointment. Call Your Doctor If Any of These Occur Redness, drainage, or swelling at the wound.  Temperature greater than 101 degrees. Severe pain not relieved by pain medication. Incision starts to come apart. Follow Up Appt Call today for appointment in 1-2 weeks ([phone]) or for problems.  If you have any hardware placed in your spine, you will need an x-ray before your appointment.  

## 2022-01-08 NOTE — Progress Notes (Signed)
Patient alert and oriented, mae's well, voiding adequate amount of urine, swallowing without difficulty, no c/o pain at time of discharge. Patient discharged home with family. Script and discharged instructions given to patient. Patient and family stated understanding of instructions given. Patient has an appointment with Dr. Pool  

## 2022-01-10 MED FILL — Sodium Chloride IV Soln 0.9%: INTRAVENOUS | Qty: 1000 | Status: AC

## 2022-01-10 MED FILL — Heparin Sodium (Porcine) Inj 1000 Unit/ML: INTRAMUSCULAR | Qty: 10 | Status: AC

## 2022-01-10 MED FILL — Heparin Sodium (Porcine) Inj 1000 Unit/ML: INTRAMUSCULAR | Qty: 30 | Status: AC

## 2022-02-20 DIAGNOSIS — M5416 Radiculopathy, lumbar region: Secondary | ICD-10-CM | POA: Diagnosis not present

## 2022-03-20 DIAGNOSIS — M5416 Radiculopathy, lumbar region: Secondary | ICD-10-CM | POA: Diagnosis not present

## 2022-04-08 ENCOUNTER — Telehealth: Payer: Self-pay | Admitting: Internal Medicine

## 2022-04-08 NOTE — Telephone Encounter (Signed)
Patient's wife called the office stating he is flaring. She states his hands and bottom of feet are painful. She states his ears and body is itching. She requests a call back on what to so since he cannot be seen in office by Dr. Dimple Casey. To reach the patient call [phone].

## 2022-04-09 ENCOUNTER — Other Ambulatory Visit: Payer: Self-pay | Admitting: Family Medicine

## 2022-04-09 DIAGNOSIS — R634 Abnormal weight loss: Secondary | ICD-10-CM

## 2022-04-09 DIAGNOSIS — R3989 Other symptoms and signs involving the genitourinary system: Secondary | ICD-10-CM | POA: Diagnosis not present

## 2022-04-15 NOTE — Telephone Encounter (Signed)
FYI- I spoke with Ms. Earhart he is seeing increased pain in hands and feet again similar to symptoms from last year. Also few areas of psoriasis rash starting again. This has been since about 1 month. He is currently taking ibuprofen 800 mg at night which helps joint pain. Says symptoms are manageable at this time just going worse direction gradually, and he scheduled follow up with [place] 8/14 plans to discuss other options then.

## 2022-04-18 ENCOUNTER — Ambulatory Visit
Admission: RE | Admit: 2022-04-18 | Discharge: 2022-04-18 | Disposition: A | Discharge status: Home / Self Care | Payer: BC Managed Care – PPO | Source: Ambulatory Visit | Attending: Family Medicine | Admitting: Family Medicine

## 2022-04-18 DIAGNOSIS — Z981 Arthrodesis status: Secondary | ICD-10-CM | POA: Diagnosis not present

## 2022-04-18 DIAGNOSIS — K5730 Diverticulosis of large intestine without perforation or abscess without bleeding: Secondary | ICD-10-CM | POA: Diagnosis not present

## 2022-04-18 DIAGNOSIS — N400 Enlarged prostate without lower urinary tract symptoms: Secondary | ICD-10-CM | POA: Diagnosis not present

## 2022-04-18 DIAGNOSIS — R634 Abnormal weight loss: Secondary | ICD-10-CM

## 2022-04-18 MED ORDER — IOPAMIDOL (ISOVUE-300) INJECTION 61%
100.0000 mL | Freq: Once | INTRAVENOUS | Status: AC | PRN
  Administered 2022-04-18: 100 mL via INTRAVENOUS

## 2022-04-22 NOTE — Progress Notes (Deleted)
Office Visit Note  Patient: Ronald Norton.             Date of Birth: 10-12-1965           MRN: [id number]             PCP: Daisy Floro, MD Referring: Daisy Floro, MD Visit Date: 05/05/2022   Subjective:  No chief complaint on file.   History of Present Illness: Ronald Norton. is a 56 y.o. male here for follow up for psoriatic arthritis.   Previous HPI 04/01/2021 Ronald Norton. is a 56 y.o. male here for follow up for psoriatic arthritis previously on Otezla 30 mg twice daily but discontinued this medicine since about 3 months ago due to increased nausea and loss of appetite.  He weaned himself off the medication by half before discontinuing since stopping it he noticed improvement with his GI symptoms.  He also experienced nausea symptoms when prescribed Celebrex for left knee osteoarthritis so did not tolerate continuing this either.  He is taking chondroitin supplement and turmeric which are providing a small benefit to his knee symptoms currently.  Since he stopped the medicine he feels that about half the time there is generalized increase in joint pains but these are manageable and improved with activity and moving.  He has noticed some skin rashes breaking out around the ears and around the groin but these improved with topical treatment.   Previous HPI: 10/02/20 Ronald Norton. is a 56 y.o. male with a history of GERD, hyperlipidemia, ED, and insomnia here to establish care for psoriatic arthritis currently on Otezla 30 mg twice daily.  He has a history of skin psoriasis historically reported including the left ear but more recently was evaluated by his dermatologist for some skin rash on the bilateral sides of his trunk treated with topical hydrocortisone.  He has a history of some chronic joint pain including the left knee he attributes to wear and tear type use but since last summer he developed worsening pain in the bilateral shoulders hands hips  feet and knees much worse than he has previously experienced.  He was evaluated at [facility] rheumatology in October with laboratory work-up with negative rheumatoid factor and normal inflammatory markers also low vitamin D at 19.3.  Based on clinical picture and testing symptoms thought to represent psoriatic arthritis and was started on [place] he felt a noticeable improvement in joint pain within about 2 weeks of starting the medication.  Since then he is doing pretty well overall though some continued pain for which he is using as needed ibuprofen which is beneficial.  His morning stiffness decreased from all morning last summer to usually less than 1 hour or after taking a hot shower in the morning.   No Rheumatology ROS completed.   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   Gastroesophageal reflux disease 10/02/2020   Vitamin D deficiency 10/02/2020   Mixed hyperlipidemia 10/02/2020   Insomnia 10/02/2020   Osteoarthritis of left knee 10/02/2020    Past Medical History:  Diagnosis Date   High cholesterol    Psoriatic arthritis (HCC)     Family History  Problem Relation Age of Onset   Arthritis Mother        degenerative disc disease   Hypertension Father  Arthritis/Rheumatoid Father    Diabetes Maternal Uncle    Diabetes Paternal Uncle    Past Surgical History:  Procedure Laterality Date   BACK SURGERY     lumbar disk and fusion   Social History   Social History Narrative   Lives home with wife, cheryl.  Is self employed.  Education AD.  Children one.    Caffeine oz at lunch.     Immunization History  Administered Date(s) Administered   PFIZER(Purple Top)SARS-COV-2 Vaccination 12/01/2019, 12/26/2019, 08/25/2020   Pfizer Covid-19 Vaccine Bivalent Booster 78yrs & up 06/28/2021     Objective: Vital Signs: There  were no vitals taken for this visit.   Physical Exam   Musculoskeletal Exam: ***  CDAI Exam: CDAI Score: -- Patient Global: --; Provider Global: -- Swollen: --; Tender: -- Joint Exam 05/05/2022   No joint exam has been documented for this visit   There is currently no information documented on the homunculus. Go to the Rheumatology activity and complete the homunculus joint exam.  Investigation: No additional findings.  Imaging: CT ABDOMEN PELVIS W CONTRAST  Result Date: 04/18/2022 CLINICAL DATA:  Unintentional 40 lb weight loss over past 3 years. EXAM: CT ABDOMEN AND PELVIS WITH CONTRAST TECHNIQUE: Multidetector CT imaging of the abdomen and pelvis was performed using the standard protocol following bolus administration of intravenous contrast. RADIATION DOSE REDUCTION: This exam was performed according to the departmental dose-optimization program which includes automated exposure control, adjustment of the mA and/or kV according to patient size and/or use of iterative reconstruction technique. CONTRAST:  ISOVUE-300 IOPAMIDOL (ISOVUE-300) INJECTION 61% COMPARISON:  None Available. FINDINGS: Lower Chest: No acute findings. Hepatobiliary: No hepatic masses identified. Gallbladder is unremarkable. No evidence of biliary ductal dilatation. Pancreas:  No mass or inflammatory changes. Spleen: Within normal limits in size and appearance. Adrenals/Urinary Tract: No masses identified. A few small renal cysts are seen bilaterally (no followup imaging recommended). No evidence of ureteral calculi or hydronephrosis. Stomach/Bowel: No evidence of obstruction, inflammatory process or abnormal fluid collections. Normal appendix visualized. Diverticulosis is seen mainly involving the descending colon, however there is no evidence of diverticulitis. Vascular/Lymphatic: No pathologically enlarged lymph nodes. No acute vascular findings. Reproductive:  Mildly enlarged prostate. Other:  None.  Musculoskeletal: No suspicious bone lesions identified. Lumbar spine fusion hardware seen at L4-5 and L5-S1. IMPRESSION: No evidence of malignancy or other acute findings. Colonic diverticulosis, without radiographic evidence of diverticulitis. Mildly enlarged prostate. Electronically Signed   By: Danae Orleans M.D.   On: 04/18/2022 14:16    Recent Labs: Lab Results  Component Value Date   WBC 4.6 01/02/2022   HGB 15.7 01/02/2022   PLT 254 01/02/2022    Speciality Comments: No specialty comments available.  Procedures:  No procedures performed Allergies: Patient has no known allergies.   Assessment / Plan:     Visit Diagnoses: No diagnosis found.  ***  Orders: No orders of the defined types were placed in this encounter.  No orders of the defined types were placed in this encounter.    Follow-Up Instructions: No follow-ups on file.   Jairo Ben, RT  Note - This record has been created using Animal nutritionist.  Chart creation errors have been sought, but may not always  have been located. Such creation errors do not reflect on  the standard of medical care.

## 2022-05-05 ENCOUNTER — Ambulatory Visit: Payer: BC Managed Care – PPO | Attending: Internal Medicine | Admitting: Internal Medicine

## 2022-05-05 DIAGNOSIS — M1712 Unilateral primary osteoarthritis, left knee: Secondary | ICD-10-CM

## 2022-05-05 DIAGNOSIS — L4050 Arthropathic psoriasis, unspecified: Secondary | ICD-10-CM

## 2022-05-05 DIAGNOSIS — K219 Gastro-esophageal reflux disease without esophagitis: Secondary | ICD-10-CM

## 2022-07-28 DIAGNOSIS — E559 Vitamin D deficiency, unspecified: Secondary | ICD-10-CM | POA: Diagnosis not present

## 2022-07-28 DIAGNOSIS — Z23 Encounter for immunization: Secondary | ICD-10-CM | POA: Diagnosis not present

## 2022-07-28 DIAGNOSIS — Z0000 Encounter for general adult medical examination without abnormal findings: Secondary | ICD-10-CM | POA: Diagnosis not present

## 2022-07-28 DIAGNOSIS — R972 Elevated prostate specific antigen [PSA]: Secondary | ICD-10-CM | POA: Diagnosis not present

## 2022-09-02 DIAGNOSIS — T1502XA Foreign body in cornea, left eye, initial encounter: Secondary | ICD-10-CM | POA: Diagnosis not present

## 2022-11-20 DIAGNOSIS — M79605 Pain in left leg: Secondary | ICD-10-CM | POA: Diagnosis not present

## 2022-11-24 ENCOUNTER — Other Ambulatory Visit (HOSPITAL_COMMUNITY): Payer: Self-pay | Admitting: *Deleted

## 2022-11-24 ENCOUNTER — Ambulatory Visit (HOSPITAL_COMMUNITY)
Admission: RE | Admit: 2022-11-24 | Discharge: 2022-11-24 | Disposition: A | Discharge status: Home / Self Care | Payer: BC Managed Care – PPO | Source: Ambulatory Visit | Attending: *Deleted | Admitting: *Deleted

## 2022-11-24 DIAGNOSIS — M79662 Pain in left lower leg: Secondary | ICD-10-CM | POA: Insufficient documentation

## 2022-11-24 NOTE — Progress Notes (Signed)
Left lower extremity venous duplex has been completed. Preliminary results can be found in CV Proc through chart review.  Results were faxed to [facility].  11/24/22 2:32 PM Ronald Norton RVT

## 2022-12-11 DIAGNOSIS — M5416 Radiculopathy, lumbar region: Secondary | ICD-10-CM | POA: Diagnosis not present

## 2022-12-25 DIAGNOSIS — Z6825 Body mass index (BMI) 25.0-25.9, adult: Secondary | ICD-10-CM | POA: Diagnosis not present

## 2022-12-25 DIAGNOSIS — M5416 Radiculopathy, lumbar region: Secondary | ICD-10-CM | POA: Diagnosis not present

## 2023-01-22 DIAGNOSIS — Z6826 Body mass index (BMI) 26.0-26.9, adult: Secondary | ICD-10-CM | POA: Diagnosis not present

## 2023-01-22 DIAGNOSIS — M5416 Radiculopathy, lumbar region: Secondary | ICD-10-CM | POA: Diagnosis not present

## 2023-08-03 DIAGNOSIS — R7301 Impaired fasting glucose: Secondary | ICD-10-CM | POA: Diagnosis not present

## 2023-08-03 DIAGNOSIS — Z23 Encounter for immunization: Secondary | ICD-10-CM | POA: Diagnosis not present

## 2023-08-03 DIAGNOSIS — G4700 Insomnia, unspecified: Secondary | ICD-10-CM | POA: Diagnosis not present

## 2023-08-03 DIAGNOSIS — K219 Gastro-esophageal reflux disease without esophagitis: Secondary | ICD-10-CM | POA: Diagnosis not present

## 2023-08-03 DIAGNOSIS — E559 Vitamin D deficiency, unspecified: Secondary | ICD-10-CM | POA: Diagnosis not present

## 2023-08-03 DIAGNOSIS — Z13220 Encounter for screening for lipoid disorders: Secondary | ICD-10-CM | POA: Diagnosis not present

## 2023-08-03 DIAGNOSIS — E782 Mixed hyperlipidemia: Secondary | ICD-10-CM | POA: Diagnosis not present

## 2023-08-03 DIAGNOSIS — Z0000 Encounter for general adult medical examination without abnormal findings: Secondary | ICD-10-CM | POA: Diagnosis not present

## 2023-08-03 DIAGNOSIS — Z125 Encounter for screening for malignant neoplasm of prostate: Secondary | ICD-10-CM | POA: Diagnosis not present

## 2023-08-03 DIAGNOSIS — L4050 Arthropathic psoriasis, unspecified: Secondary | ICD-10-CM | POA: Diagnosis not present
